# Patient Record
Sex: Female | Born: 1970 | Race: Black or African American | Hispanic: No | Smoking: Current every day smoker
Health system: Southern US, Community
[De-identification: ages and names within clinical notes are randomized; demographics above are authoritative.]

## PROBLEM LIST (undated history)

## (undated) DIAGNOSIS — K5792 Diverticulitis of intestine, part unspecified, without perforation or abscess without bleeding: Secondary | ICD-10-CM

## (undated) DIAGNOSIS — K219 Gastro-esophageal reflux disease without esophagitis: Secondary | ICD-10-CM

## (undated) DIAGNOSIS — K5790 Diverticulosis of intestine, part unspecified, without perforation or abscess without bleeding: Secondary | ICD-10-CM

## (undated) DIAGNOSIS — G43909 Migraine, unspecified, not intractable, without status migrainosus: Secondary | ICD-10-CM

## (undated) DIAGNOSIS — R42 Dizziness and giddiness: Secondary | ICD-10-CM

## (undated) HISTORY — PX: UMBILICAL HERNIA REPAIR: SHX196

## (undated) HISTORY — DX: Gastro-esophageal reflux disease without esophagitis: K21.9

## (undated) HISTORY — DX: Diverticulosis of intestine, part unspecified, without perforation or abscess without bleeding: K57.90

## (undated) HISTORY — PX: TUBAL LIGATION: SHX77

## (undated) HISTORY — PX: OTHER SURGICAL HISTORY: SHX169

## (undated) HISTORY — DX: Migraine, unspecified, not intractable, without status migrainosus: G43.909

---

## 1993-04-28 HISTORY — PX: THYROIDECTOMY: SHX17

## 1997-11-10 ENCOUNTER — Emergency Department (HOSPITAL_COMMUNITY): Admission: EM | Admit: 1997-11-10 | Discharge: 1997-11-11 | Payer: Self-pay | Admitting: Emergency Medicine

## 1997-11-28 ENCOUNTER — Other Ambulatory Visit: Admission: RE | Admit: 1997-11-28 | Discharge: 1997-11-28 | Payer: Self-pay | Admitting: Obstetrics and Gynecology

## 1998-01-11 ENCOUNTER — Emergency Department (HOSPITAL_COMMUNITY): Admission: EM | Admit: 1998-01-11 | Discharge: 1998-01-11 | Payer: Self-pay

## 1998-01-18 ENCOUNTER — Emergency Department (HOSPITAL_COMMUNITY): Admission: EM | Admit: 1998-01-18 | Discharge: 1998-01-18 | Payer: Self-pay | Admitting: Emergency Medicine

## 1998-06-13 ENCOUNTER — Emergency Department (HOSPITAL_COMMUNITY): Admission: EM | Admit: 1998-06-13 | Discharge: 1998-06-13 | Payer: Self-pay | Admitting: Emergency Medicine

## 1999-02-12 ENCOUNTER — Emergency Department (HOSPITAL_COMMUNITY): Admission: EM | Admit: 1999-02-12 | Discharge: 1999-02-12 | Payer: Self-pay | Admitting: Emergency Medicine

## 1999-03-04 ENCOUNTER — Other Ambulatory Visit: Admission: RE | Admit: 1999-03-04 | Discharge: 1999-03-04 | Payer: Self-pay | Admitting: *Deleted

## 1999-07-19 ENCOUNTER — Ambulatory Visit (HOSPITAL_COMMUNITY): Admission: RE | Admit: 1999-07-19 | Discharge: 1999-07-19 | Payer: Self-pay | Admitting: Obstetrics and Gynecology

## 2000-02-17 ENCOUNTER — Ambulatory Visit (HOSPITAL_COMMUNITY): Admission: RE | Admit: 2000-02-17 | Discharge: 2000-02-17 | Payer: Self-pay | Admitting: Family Medicine

## 2000-02-17 ENCOUNTER — Encounter: Payer: Self-pay | Admitting: Family Medicine

## 2000-11-21 ENCOUNTER — Emergency Department (HOSPITAL_COMMUNITY): Admission: EM | Admit: 2000-11-21 | Discharge: 2000-11-21 | Payer: Self-pay

## 2000-11-23 ENCOUNTER — Emergency Department (HOSPITAL_COMMUNITY): Admission: EM | Admit: 2000-11-23 | Discharge: 2000-11-23 | Payer: Self-pay | Admitting: Emergency Medicine

## 2010-07-13 ENCOUNTER — Emergency Department (HOSPITAL_COMMUNITY)
Admission: EM | Admit: 2010-07-13 | Discharge: 2010-07-13 | Disposition: A | Discharge status: Home / Self Care | Payer: Self-pay | Attending: Emergency Medicine | Admitting: Emergency Medicine

## 2010-07-13 DIAGNOSIS — K219 Gastro-esophageal reflux disease without esophagitis: Secondary | ICD-10-CM | POA: Insufficient documentation

## 2010-07-13 DIAGNOSIS — A5903 Trichomonal cystitis and urethritis: Secondary | ICD-10-CM | POA: Insufficient documentation

## 2010-07-13 DIAGNOSIS — M7989 Other specified soft tissue disorders: Secondary | ICD-10-CM | POA: Insufficient documentation

## 2010-07-13 DIAGNOSIS — R079 Chest pain, unspecified: Secondary | ICD-10-CM | POA: Insufficient documentation

## 2010-07-13 DIAGNOSIS — R110 Nausea: Secondary | ICD-10-CM | POA: Insufficient documentation

## 2010-07-13 LAB — COMPREHENSIVE METABOLIC PANEL
ALT: 14 U/L (ref 0–35)
AST: 15 U/L (ref 0–37)
Albumin: 3.3 g/dL — ABNORMAL LOW (ref 3.5–5.2)
Alkaline Phosphatase: 37 U/L — ABNORMAL LOW (ref 39–117)
BUN: 6 mg/dL (ref 6–23)
CO2: 29 mEq/L (ref 19–32)
Calcium: 8.8 mg/dL (ref 8.4–10.5)
Chloride: 106 mEq/L (ref 96–112)
Creatinine, Ser: 0.72 mg/dL (ref 0.4–1.2)
GFR calc Af Amer: >60 mL/min (ref >60)
GFR calc non Af Amer: >60 mL/min (ref >60)
Glucose, Bld: 88 mg/dL (ref 70–99)
Potassium: 4.3 mEq/L (ref 3.5–5.1)
Sodium: 140 mEq/L (ref 135–145)
Total Bilirubin: 0.3 mg/dL (ref 0.3–1.2)
Total Protein: 6.1 g/dL (ref 6.0–8.3)

## 2010-07-13 LAB — URINALYSIS, ROUTINE W REFLEX MICROSCOPIC
Bilirubin Urine: NEGATIVE
Glucose, UA: NEGATIVE mg/dL
Ketones, ur: NEGATIVE mg/dL
Nitrite: NEGATIVE
Protein, ur: NEGATIVE mg/dL
Specific Gravity, Urine: 1.011 (ref 1.005–1.030)
Urobilinogen, UA: 0.2 mg/dL (ref 0.0–1.0)
pH: 8 (ref 5.0–8.0)

## 2010-07-13 LAB — URINE MICROSCOPIC-ADD ON

## 2010-07-13 LAB — PREGNANCY, URINE: Preg Test, Ur: NEGATIVE

## 2010-07-15 LAB — URINE CULTURE
Colony Count: 35000
Culture  Setup Time: 201203181159

## 2010-10-01 ENCOUNTER — Emergency Department (HOSPITAL_COMMUNITY)
Admission: EM | Admit: 2010-10-01 | Discharge: 2010-10-01 | Disposition: A | Discharge status: Home / Self Care | Payer: Self-pay | Attending: Emergency Medicine | Admitting: Emergency Medicine

## 2010-10-01 DIAGNOSIS — R062 Wheezing: Secondary | ICD-10-CM | POA: Insufficient documentation

## 2010-10-01 DIAGNOSIS — J40 Bronchitis, not specified as acute or chronic: Secondary | ICD-10-CM | POA: Insufficient documentation

## 2010-10-01 DIAGNOSIS — R059 Cough, unspecified: Secondary | ICD-10-CM | POA: Insufficient documentation

## 2010-10-01 DIAGNOSIS — R05 Cough: Secondary | ICD-10-CM | POA: Insufficient documentation

## 2012-10-18 ENCOUNTER — Emergency Department (HOSPITAL_BASED_OUTPATIENT_CLINIC_OR_DEPARTMENT_OTHER): Payer: Self-pay

## 2012-10-18 ENCOUNTER — Encounter (HOSPITAL_BASED_OUTPATIENT_CLINIC_OR_DEPARTMENT_OTHER): Payer: Self-pay

## 2012-10-18 ENCOUNTER — Emergency Department (HOSPITAL_BASED_OUTPATIENT_CLINIC_OR_DEPARTMENT_OTHER)
Admission: EM | Admit: 2012-10-18 | Discharge: 2012-10-18 | Disposition: A | Discharge status: Home / Self Care | Payer: Self-pay | Attending: Emergency Medicine | Admitting: Emergency Medicine

## 2012-10-18 DIAGNOSIS — Z8719 Personal history of other diseases of the digestive system: Secondary | ICD-10-CM | POA: Insufficient documentation

## 2012-10-18 DIAGNOSIS — Z8669 Personal history of other diseases of the nervous system and sense organs: Secondary | ICD-10-CM | POA: Insufficient documentation

## 2012-10-18 DIAGNOSIS — M542 Cervicalgia: Secondary | ICD-10-CM | POA: Insufficient documentation

## 2012-10-18 DIAGNOSIS — M79609 Pain in unspecified limb: Secondary | ICD-10-CM | POA: Insufficient documentation

## 2012-10-18 HISTORY — DX: Diverticulitis of intestine, part unspecified, without perforation or abscess without bleeding: K57.92

## 2012-10-18 HISTORY — DX: Dizziness and giddiness: R42

## 2012-10-18 MED ORDER — DIAZEPAM 5 MG PO TABS: 5.0000 mg | ORAL_TABLET | Freq: Two times a day (BID) | ORAL | Status: DC

## 2012-10-18 MED ORDER — HYDROCODONE-ACETAMINOPHEN 5-325 MG PO TABS: 1.0000 | ORAL_TABLET | Freq: Four times a day (QID) | ORAL | Status: DC | PRN

## 2012-10-18 MED ORDER — DIAZEPAM 5 MG/ML IJ SOLN
5.0000 mg | Freq: Once | INTRAMUSCULAR | Status: AC
  Administered 2012-10-18: 5 mg via INTRAMUSCULAR
  Filled 2012-10-18: qty 2

## 2012-10-18 MED ORDER — KETOROLAC TROMETHAMINE 30 MG/ML IJ SOLN
60.0000 mg | Freq: Once | INTRAMUSCULAR | Status: AC
  Administered 2012-10-18: 60 mg via INTRAMUSCULAR
  Filled 2012-10-18 (×2): qty 1

## 2012-10-18 NOTE — ED Provider Notes (Signed)
Medical screening examination/treatment/procedure(s) were performed by non-physician practitioner and as supervising physician I was immediately available for consultation/collaboration.   Rolan Bucco, MD 10/18/12 [phone]

## 2012-10-18 NOTE — ED Provider Notes (Signed)
History    CSN: [id number] Arrival date & time 10/18/12  1845  First MD Initiated Contact with Patient 10/18/12 1905     Chief Complaint  Patient presents with  . Neck Pain  . Arm Pain   (Consider location/radiation/quality/duration/timing/severity/associated sxs/prior Treatment) HPI Comments: Pt states that she has no known injury:pt states that he pain radiates down her arm and it feels like it is asleep  Patient is a 42 y.o. female presenting with neck pain and arm pain. The history is provided by the patient. No language interpreter was used.  Neck Pain Pain location:  L side Quality:  Aching Pain radiates to:  L arm Pain severity:  Severe Pain is:  Same all the time Onset quality:  Gradual Timing:  Constant Progression:  Unable to specify Chronicity:  New Worsened by:  Nothing tried Ineffective treatments:  Heat and ice Associated symptoms: no weakness   Arm Pain Associated symptoms include neck pain. Pertinent negatives include no weakness.   Past Medical History  Diagnosis Date  . Vertigo   . Diverticulitis    Past Surgical History  Procedure Laterality Date  . Tubal ligation    . Thyroidectomy     No family history on file. History  Substance Use Topics  . Smoking status: Never Smoker   . Smokeless tobacco: Not on file  . Alcohol Use: No   OB History   Grav Para Term Preterm Abortions TAB SAB Ect Mult Living                 Review of Systems  HENT: Positive for neck pain.   Respiratory: Negative.   Cardiovascular: Negative.   Neurological: Negative for weakness.    Allergies  Review of patient's allergies indicates no known allergies.  Home Medications   Current Outpatient Rx  Name  Route  Sig  Dispense  Refill  . meclizine (ANTIVERT) 25 MG tablet   Oral   Take 25 mg by mouth 3 (three) times daily as needed.          BP 121/65  Pulse 82  Temp(Src) 98.8 F (37.1 C) (Oral)  Resp 18  Ht 5\' 2"  (1.575 m)  Wt 178 lb (80.74 kg)  BMI  32.55 kg/m2  SpO2 100%  LMP 10/02/2012 Physical Exam  Nursing note and vitals reviewed. Constitutional: She is oriented to person, place, and time. She appears well-developed and well-nourished.  Cardiovascular: Normal rate and regular rhythm.   Pulmonary/Chest: Effort normal and breath sounds normal.  Musculoskeletal: Normal range of motion.  Left cervical paraspinal tenderness:grip strength equal:pt has full rom of left WUJ:[id number] intact:pulses intact  Neurological: She is alert and oriented to person, place, and time.  Skin: Skin is warm and dry.  Psychiatric: She has a normal mood and affect.    ED Course  Procedures (including critical care time) Labs Reviewed - No data to display Dg Cervical Spine Complete  10/18/2012   *RADIOLOGY REPORT*  Clinical Data: Left-sided neck pain radiating to the left arm, no injury, prior thyroidectomy  CERVICAL SPINE - COMPLETE 4+ VIEW  Comparison: None.  Findings: The cervical vertebrae are straightened in alignment. Intervertebral disc spaces appear normal.  No prevertebral soft tissue swelling is seen.  Multiple surgical clips overlie the lower neck from prior right thyroidectomy.  On oblique views, the foramina are widely patent.  The odontoid process is intact.  The lung apices are clear.  IMPRESSION:  1.  Straightened alignment with normal disc spaces. 2.  The foramina are patent bilaterally.   Original Report Authenticated By: Dwyane Dee, M.D.   1. Neck pain     MDM  Pt feeling better at this time:will treat symptomatically with vicodin and valium and have follow up with Dr. Vivi Barrack, NP 10/18/12 2022

## 2012-10-18 NOTE — ED Notes (Signed)
Pt reports neck pain radiating to left arm associated with intermittent numbness in left arm since yesterday.

## 2012-10-26 ENCOUNTER — Emergency Department (HOSPITAL_BASED_OUTPATIENT_CLINIC_OR_DEPARTMENT_OTHER)
Admission: EM | Admit: 2012-10-26 | Discharge: 2012-10-26 | Disposition: A | Discharge status: Home / Self Care | Payer: Self-pay | Attending: Emergency Medicine | Admitting: Emergency Medicine

## 2012-10-26 ENCOUNTER — Encounter (HOSPITAL_BASED_OUTPATIENT_CLINIC_OR_DEPARTMENT_OTHER): Payer: Self-pay | Admitting: *Deleted

## 2012-10-26 DIAGNOSIS — M5412 Radiculopathy, cervical region: Secondary | ICD-10-CM | POA: Insufficient documentation

## 2012-10-26 DIAGNOSIS — Z8719 Personal history of other diseases of the digestive system: Secondary | ICD-10-CM | POA: Insufficient documentation

## 2012-10-26 DIAGNOSIS — R42 Dizziness and giddiness: Secondary | ICD-10-CM | POA: Insufficient documentation

## 2012-10-26 DIAGNOSIS — M436 Torticollis: Secondary | ICD-10-CM | POA: Insufficient documentation

## 2012-10-26 DIAGNOSIS — Z87891 Personal history of nicotine dependence: Secondary | ICD-10-CM | POA: Insufficient documentation

## 2012-10-26 DIAGNOSIS — R209 Unspecified disturbances of skin sensation: Secondary | ICD-10-CM | POA: Insufficient documentation

## 2012-10-26 MED ORDER — OXYCODONE-ACETAMINOPHEN 5-325 MG PO TABS
1.0000 | ORAL_TABLET | Freq: Once | ORAL | Status: AC
  Administered 2012-10-26: 1 via ORAL
  Filled 2012-10-26 (×2): qty 1

## 2012-10-26 MED ORDER — CYCLOBENZAPRINE HCL 10 MG PO TABS: 10.0000 mg | ORAL_TABLET | Freq: Three times a day (TID) | ORAL | Status: DC | PRN

## 2012-10-26 MED ORDER — CYCLOBENZAPRINE HCL 10 MG PO TABS
10.0000 mg | ORAL_TABLET | Freq: Once | ORAL | Status: AC
  Administered 2012-10-26: 10 mg via ORAL
  Filled 2012-10-26: qty 1

## 2012-10-26 MED ORDER — OXYCODONE-ACETAMINOPHEN 5-325 MG PO TABS: 1.0000 | ORAL_TABLET | ORAL | Status: DC | PRN

## 2012-10-26 NOTE — ED Provider Notes (Signed)
History  This chart was scribed for Kristin Booze, MD by Ardelia Mems, ED Scribe. This patient was seen in room MH01/MH01 and the patient's care was started at 12:39 AM.  CSN: [id number]  Arrival date & time 10/26/12  0011     Chief Complaint  Patient presents with  . Neck Pain    The history is provided by the patient. No language interpreter was used.   HPI Comments: Kristin Mcintyre is a 42 y.o. female who presents to the Emergency Department complaining of 1 week of constant, severe, "9/10" left-sided neck pain that radiates to her left shoulder pain and left arm. She reports associated intermittent numbness and tingling to the 4th and 5th digits of her right hand. Pt is right-handed. Pt was seen in the ED for the same pain on 10/18/12 and was discharged with Valium and Vicodin which offered relief of her pain.  Pt did not follow-up with Dr. Andree Coss as instructed on discharge. Pt denies injury and states that her pain is worse than 1 week ago. Pt states that she has been driving, but has otherwise not exacerbated her neck pain. Pt is lying with her head turned to the right and states that this is more comfortable for her. Pt states that she is now out of the her Vicodin and Valium, and she requests for pain medication, although not specifically for narcotics. Pt states that she is allergic to Tylenol. Pt states that she is i otherwise good health. Pt denies back pain, abdominal pain, fever, vomiting or any other symptoms. Pt states that she is a former smoker.   PCP- None  Past Medical History  Diagnosis Date  . Vertigo   . Diverticulitis    Past Surgical History  Procedure Laterality Date  . Tubal ligation    . Thyroidectomy     History reviewed. No pertinent family history. History  Substance Use Topics  . Smoking status: Never Smoker   . Smokeless tobacco: Not on file  . Alcohol Use: No   OB History   Grav Para Term Preterm Abortions TAB SAB Ect Mult Living                  Review of Systems  Constitutional: Negative for chills.  HENT: Positive for neck stiffness. Negative for congestion, sore throat and rhinorrhea.   Eyes: Negative for visual disturbance.  Respiratory: Negative for cough.   Gastrointestinal: Negative for nausea, vomiting, abdominal pain and diarrhea.  Genitourinary: Negative for dysuria.  Musculoskeletal: Negative for back pain.  Skin: Negative for pallor.  Neurological:       Intermittent tingling.  Psychiatric/Behavioral: Negative for confusion.  A complete 10 system review of systems was obtained and all systems are negative except as noted in the HPI and PMH.   Allergies  Review of patient's allergies indicates no known allergies.  Home Medications   Current Outpatient Rx  Name  Route  Sig  Dispense  Refill  . diazepam (VALIUM) 5 MG tablet   Oral   Take 1 tablet (5 mg total) by mouth 2 (two) times daily.   10 tablet   0   . HYDROcodone-acetaminophen (NORCO/VICODIN) 5-325 MG per tablet   Oral   Take 1 tablet by mouth every 6 (six) hours as needed for pain.   10 tablet   0   . meclizine (ANTIVERT) 25 MG tablet   Oral   Take 25 mg by mouth 3 (three) times daily as needed.  Triage Vitals: BP 113/61  Pulse 82  Temp(Src) 97.8 F (36.6 C) (Oral)  Resp 18  Ht 5\' 2"  (1.575 m)  Wt 178 lb (80.74 kg)  BMI 32.55 kg/m2  SpO2 100%  LMP 10/02/2012  Physical Exam  Nursing note and vitals reviewed. Constitutional: She appears well-developed and well-nourished.  Head rotated to her right.  HENT:  Head: Normocephalic and atraumatic.  Eyes: EOM are normal. Pupils are equal, round, and reactive to light.  Neck: No tracheal deviation present.  Mild tenderness of C-spine. Marked spasm and tenderness of the left sternocleidomastoid muscle. Pain with passive ROM.  Cardiovascular: Normal rate and regular rhythm.   Pulmonary/Chest: Effort normal. No respiratory distress.  Abdominal: Soft. There is no tenderness.   Neurological: She is alert.  Skin: Skin is warm and dry. No rash noted.  Psychiatric: She has a normal mood and affect. Her behavior is normal.    ED Course  Procedures (including critical care time)  DIAGNOSTIC STUDIES: Oxygen Saturation is 100% on RA, normal by my interpretation.    COORDINATION OF CARE: 12:44 AM- Pt advised of plan for pain management with flexeril and Oxycodone and pt agrees.  Medications  cyclobenzaprine (FLEXERIL) tablet 10 mg (not administered)  oxyCODONE-acetaminophen (PERCOCET/ROXICET) 5-325 MG per tablet 1 tablet (not administered)     1. Torticollis   2. Cervical radiculopathy     MDM  Neck pain with torticollis. New numbness suggests component of a cervical radiculopathy. No evidence of neurologic injury on exam. And she was seen in the ED 1 week ago and diagnosed with neck pain and discharged with a prescription for 10 Valium and 10 Vicodin tablets. She had been referred to sports medicine but had not followed up. She's given repeat referral to sports medicine. There is no indication for cervical spine imaging tonight. She's given prescriptions for Percocet, and Flexeril. She states that she is intolerant of NSAIDs.    I personally performed the services described in this documentation, which was scribed in my presence. The recorded information has been reviewed and is accurate.     Kristin Booze, MD 10/26/12 [phone]

## 2012-10-26 NOTE — ED Notes (Addendum)
Pt reports left side neck pain which radiates into left shoulder and arm x 1 week denies injury seen here 6/23 for same didn't f/u with [facility] , pt reports out of valium and vicodin

## 2012-10-27 ENCOUNTER — Encounter: Payer: Self-pay | Admitting: Family Medicine

## 2012-10-27 ENCOUNTER — Ambulatory Visit (INDEPENDENT_AMBULATORY_CARE_PROVIDER_SITE_OTHER): Payer: Self-pay | Admitting: Family Medicine

## 2012-10-27 VITALS — BP 109/74 | HR 102 | Ht 62.0 in | Wt 178.0 lb

## 2012-10-27 DIAGNOSIS — M5010 Cervical disc disorder with radiculopathy, unspecified cervical region: Secondary | ICD-10-CM

## 2012-10-27 DIAGNOSIS — M5412 Radiculopathy, cervical region: Secondary | ICD-10-CM

## 2012-10-27 MED ORDER — PREDNISONE 10 MG PO TABS: ORAL_TABLET | ORAL | Status: DC

## 2012-10-27 MED ORDER — DIAZEPAM 5 MG PO TABS: 5.0000 mg | ORAL_TABLET | Freq: Three times a day (TID) | ORAL | Status: DC | PRN

## 2012-10-27 MED ORDER — OXYCODONE-ACETAMINOPHEN 5-325 MG PO TABS: 1.0000 | ORAL_TABLET | Freq: Four times a day (QID) | ORAL | Status: DC | PRN

## 2012-10-27 NOTE — Patient Instructions (Addendum)
You have cervical radiculopathy (a pinched nerve in the neck). Prednisone 6 day dose pack as directed. Valium as needed for spasms - no driving on this. Percocet as needed for severe pain (no driving on this medicine). Consider cervical collar if severely painful. Simple range of motion exercises within limits of pain to prevent further stiffness. Consider physical therapy for stretching, exercises, traction, and modalities. Call me in 1 week to let me know how you're doing - if you're better we'll start physical therapy.  If worsening next step is usually an MRI. Heat 15 minutes at a time 3-4 times a day to help with spasms. Watch head position when on computers, texting, when sleeping in bed - should in line with back to prevent further nerve traction and irritation. Follow up in 6 weeks if you start physical therapy and are doing well.

## 2012-11-01 ENCOUNTER — Encounter: Payer: Self-pay | Admitting: Family Medicine

## 2012-11-01 DIAGNOSIS — M79602 Pain in left arm: Secondary | ICD-10-CM | POA: Insufficient documentation

## 2012-11-01 NOTE — Assessment & Plan Note (Signed)
Left cervical radiculopathy - start with prednisone dose pack, valium and percocet as needed.  Cervical collar for support.  Will see how she does over the next week - consider PT if improving, MRI if not improving to assess for disc herniation.  Heat as needed for spasms.  Discussed ergonomic issues.  If doing well f/u in 6 weeks.

## 2012-11-01 NOTE — Progress Notes (Signed)
Patient ID: Kristin Mcintyre, female   DOB: [birth year], 42 y.o.   MRN: [id number]  PCP: No primary provider on file.  Subjective:   HPI: Patient is a 42 y.o. female here for neck pain.  Patient reports she started to develop neck pain at the end of May though worsened about 1-2 weeks ago. Started to get pain reaching for thing with pain radiating down left arm to wrist and fingers with numbness and tingling. + night pain. No bowel/bladder dysfunction. Tried icing, pain medication and valium from ED. No prior issues with neck.  Past Medical History  Diagnosis Date  . Vertigo   . Diverticulitis   . GERD (gastroesophageal reflux disease)     Current Outpatient Prescriptions on File Prior to Visit  Medication Sig Dispense Refill  . cyclobenzaprine (FLEXERIL) 10 MG tablet Take 1 tablet (10 mg total) by mouth 3 (three) times daily as needed for muscle spasms.  30 tablet  0  . meclizine (ANTIVERT) 25 MG tablet Take 25 mg by mouth 3 (three) times daily as needed.       No current facility-administered medications on file prior to visit.    Past Surgical History  Procedure Laterality Date  . Tubal ligation    . Thyroidectomy      No Known Allergies  History   Social History  . Marital Status: Widowed    Spouse Name: N/A    Number of Children: N/A  . Years of Education: N/A   Occupational History  . Not on file.   Social History Main Topics  . Smoking status: Never Smoker   . Smokeless tobacco: Not on file  . Alcohol Use: No  . Drug Use: No  . Sexually Active: Yes    Birth Control/ Protection: None   Other Topics Concern  . Not on file   Social History Narrative  . No narrative on file    Family History  Problem Relation Age of Onset  . Sudden death Neg Hx   . Hypertension Neg Hx   . Hyperlipidemia Neg Hx   . Heart attack Neg Hx   . Diabetes Neg Hx     BP 109/74  Pulse 102  Ht 5\' 2"  (1.575 m)  Wt 178 lb (80.74 kg)  BMI 32.55 kg/m2  LMP  10/02/2012  Review of Systems: See HPI above.    Objective:  Physical Exam:  Gen: NAD  Neck: No gross deformity, swelling, bruising. TTP left cervical paraspinal region.  No midline/bony TTP. FROM neck - pain with extension, flexion, left lateral rotation. BUE strength 5/5.   Sensation diminished to light touch in left hand, forearm.   2+ equal reflexes in triceps, biceps, brachioradialis tendons. Negative spurlings.    Assessment & Plan:  1. Left cervical radiculopathy - start with prednisone dose pack, valium and percocet as needed.  Cervical collar for support.  Will see how she does over the next week - consider PT if improving, MRI if not improving to assess for disc herniation.  Heat as needed for spasms.  Discussed ergonomic issues.  If doing well f/u in 6 weeks.

## 2012-11-05 ENCOUNTER — Ambulatory Visit: Payer: Self-pay

## 2012-12-02 ENCOUNTER — Ambulatory Visit (INDEPENDENT_AMBULATORY_CARE_PROVIDER_SITE_OTHER): Payer: Self-pay | Admitting: Family Medicine

## 2012-12-02 ENCOUNTER — Encounter: Payer: Self-pay | Admitting: Family Medicine

## 2012-12-02 VITALS — BP 116/67 | HR 91 | Ht 62.0 in | Wt 178.0 lb

## 2012-12-02 DIAGNOSIS — M5412 Radiculopathy, cervical region: Secondary | ICD-10-CM

## 2012-12-02 DIAGNOSIS — M5010 Cervical disc disorder with radiculopathy, unspecified cervical region: Secondary | ICD-10-CM

## 2012-12-02 MED ORDER — PREDNISONE 10 MG PO TABS: ORAL_TABLET | ORAL | Status: DC

## 2012-12-03 NOTE — Patient Instructions (Addendum)
Verbal: Patient will call us when she receives medicaid card to go ahead with MRI

## 2012-12-06 ENCOUNTER — Encounter: Payer: Self-pay | Admitting: Family Medicine

## 2012-12-06 NOTE — Assessment & Plan Note (Signed)
still awaiting medicaid card to go ahead with next steps.  Suspect disc herniation as cause.  Continue with home stretches, exercises, cervical collar.  Trial longer prednisone dose pack.  Continue other medicines as she has at home (valium, percocet as needed).  MRI as next step and consider ESIs, neurosurgery referral depending on results.

## 2012-12-06 NOTE — Progress Notes (Signed)
Patient ID: Kristin Mcintyre, female   DOB: [birth year], 42 y.o.   MRN: [id number]  PCP: No primary provider on file.  Subjective:   HPI: Patient is a 42 y.o. female here for f/u neck pain.  7/2: Patient reports she started to develop neck pain at the end of May though worsened about 1-2 weeks ago. Started to get pain reaching for thing with pain radiating down left arm to wrist and fingers with numbness and tingling. + night pain. No bowel/bladder dysfunction. Tried icing, pain medication and valium from ED. No prior issues with neck.  8/7: Patient states the prednisone helped while she was on it but this recurred about 2 days after finishing the medicine. Applied and got medicaid but does not have the card for this yet. Using cervical collar. Taking valium, oxycodone and using heat. Radiates down to shoulder, elbow, middle finger on left. No bowel/bladder dysfunction.  Past Medical History  Diagnosis Date  . Vertigo   . Diverticulitis   . GERD (gastroesophageal reflux disease)     Current Outpatient Prescriptions on File Prior to Visit  Medication Sig Dispense Refill  . cyclobenzaprine (FLEXERIL) 10 MG tablet Take 1 tablet (10 mg total) by mouth 3 (three) times daily as needed for muscle spasms.  30 tablet  0  . diazepam (VALIUM) 5 MG tablet Take 1 tablet (5 mg total) by mouth every 8 (eight) hours as needed for anxiety.  60 tablet  0  . meclizine (ANTIVERT) 25 MG tablet Take 25 mg by mouth 3 (three) times daily as needed.      Marland Kitchen oxyCODONE-acetaminophen (ROXICET) 5-325 MG per tablet Take 1 tablet by mouth every 6 (six) hours as needed for pain.  60 tablet  0   No current facility-administered medications on file prior to visit.    Past Surgical History  Procedure Laterality Date  . Tubal ligation    . Thyroidectomy      No Known Allergies  History   Social History  . Marital Status: Widowed    Spouse Name: N/A    Number of Children: N/A  . Years of Education:  N/A   Occupational History  . Not on file.   Social History Main Topics  . Smoking status: Never Smoker   . Smokeless tobacco: Not on file  . Alcohol Use: No  . Drug Use: No  . Sexually Active: Yes    Birth Control/ Protection: None   Other Topics Concern  . Not on file   Social History Narrative  . No narrative on file    Family History  Problem Relation Age of Onset  . Sudden death Neg Hx   . Hypertension Neg Hx   . Hyperlipidemia Neg Hx   . Heart attack Neg Hx   . Diabetes Neg Hx     BP 116/67  Pulse 91  Ht 5\' 2"  (1.575 m)  Wt 178 lb (80.74 kg)  BMI 32.55 kg/m2  Review of Systems: See HPI above.    Objective:  Physical Exam:  Gen: NAD  Neck: No gross deformity, swelling, bruising. TTP left > right cervical paraspinal region.  No midline/bony TTP. FROM neck - pain with extension, flexion, left lateral rotation. BUE strength 5/5.   Sensation diminished to light touch in left hand, forearm, 3rd digit.   2+ equal reflexes in triceps, biceps, brachioradialis tendons. Negative spurlings.    Assessment & Plan:  1. Left cervical radiculopathy - still awaiting medicaid card to go ahead with  next steps.  Suspect disc herniation as cause.  Continue with home stretches, exercises, cervical collar.  Trial longer prednisone dose pack.  Continue other medicines as she has at home (valium, percocet as needed).  MRI as next step and consider ESIs, neurosurgery referral depending on results.

## 2013-01-06 NOTE — Addendum Note (Signed)
Addended by: Lenda Kelp on: 01/06/2013 12:50 PM   Modules accepted: Orders

## 2013-01-17 ENCOUNTER — Telehealth: Payer: Self-pay | Admitting: *Deleted

## 2013-01-17 NOTE — Telephone Encounter (Signed)
No refills on those two medications.  A month and a half ago she said she had medicaid and was waiting on the card so we could do an MRI, consider ESIs.  Check on status of that.  Thanks!

## 2013-01-17 NOTE — Telephone Encounter (Signed)
Patient is out of Roxicet 5/325 and also the valium 5 mg. Has been out for about 2-3 weeks. Wants to know if she can get a refill on both of these prescriptions.

## 2013-01-17 NOTE — Telephone Encounter (Signed)
A MRI has been set for her on 01-18-13 (tomorrow). Then she has an appointment here on 01-19-13 (wednesday).

## 2013-01-18 ENCOUNTER — Ambulatory Visit (INDEPENDENT_AMBULATORY_CARE_PROVIDER_SITE_OTHER): Payer: Medicaid Other

## 2013-01-18 DIAGNOSIS — M5124 Other intervertebral disc displacement, thoracic region: Secondary | ICD-10-CM

## 2013-01-18 DIAGNOSIS — M5010 Cervical disc disorder with radiculopathy, unspecified cervical region: Secondary | ICD-10-CM

## 2013-01-19 ENCOUNTER — Ambulatory Visit (INDEPENDENT_AMBULATORY_CARE_PROVIDER_SITE_OTHER): Payer: Medicaid Other | Admitting: Family Medicine

## 2013-01-19 DIAGNOSIS — M79602 Pain in left arm: Secondary | ICD-10-CM

## 2013-01-19 DIAGNOSIS — M542 Cervicalgia: Secondary | ICD-10-CM

## 2013-01-19 DIAGNOSIS — M79609 Pain in unspecified limb: Secondary | ICD-10-CM

## 2013-01-19 MED ORDER — TRAMADOL HCL 50 MG PO TABS: 50.0000 mg | ORAL_TABLET | Freq: Four times a day (QID) | ORAL | Status: DC | PRN

## 2013-01-19 MED ORDER — CYCLOBENZAPRINE HCL 10 MG PO TABS: 10.0000 mg | ORAL_TABLET | Freq: Three times a day (TID) | ORAL | Status: DC | PRN

## 2013-01-19 NOTE — Patient Instructions (Addendum)
Unfortunately your MRI did not find the cause of your pain and radiation into chest, left arm. We will refer you to neurology for further evaluation. In meantime tylenol + tramadol as needed for pain. Flexeril as needed for spasms. Follow up with me as needed.

## 2013-01-20 ENCOUNTER — Encounter: Payer: Self-pay | Admitting: Family Medicine

## 2013-01-20 NOTE — Progress Notes (Signed)
Patient ID: Kristin Mcintyre, female   DOB: [birth year], 42 y.o.   MRN: [id number]  PCP: No primary provider on file.  Subjective:   HPI: Patient is a 42 y.o. female here for f/u neck pain.  7/2: Patient reports she started to develop neck pain at the end of May though worsened about 1-2 weeks ago. Started to get pain reaching for thing with pain radiating down left arm to wrist and fingers with numbness and tingling. + night pain. No bowel/bladder dysfunction. Tried icing, pain medication and valium from ED. No prior issues with neck.  8/7: Patient states the prednisone helped while she was on it but this recurred about 2 days after finishing the medicine. Applied and got medicaid but does not have the card for this yet. Using cervical collar. Taking valium, oxycodone and using heat. Radiates down to shoulder, elbow, middle finger on left. No bowel/bladder dysfunction.  9/24: Patient returns for MRI results. Continues to have pain in neck and down both shoulders but mainly into left shoulder, clavicle, breast. Feels like it's swollen here. Goes all the way down to fingers of left hand and reports numbness thoughout. No bowel/bladder dysfunction. Out of valium, oxycodone.  Past Medical History  Diagnosis Date  . Vertigo   . Diverticulitis   . GERD (gastroesophageal reflux disease)     Current Outpatient Prescriptions on File Prior to Visit  Medication Sig Dispense Refill  . meclizine (ANTIVERT) 25 MG tablet Take 25 mg by mouth 3 (three) times daily as needed.       No current facility-administered medications on file prior to visit.    Past Surgical History  Procedure Laterality Date  . Tubal ligation    . Thyroidectomy      No Known Allergies  History   Social History  . Marital Status: Widowed    Spouse Name: N/A    Number of Children: N/A  . Years of Education: N/A   Occupational History  . Not on file.   Social History Main Topics  . Smoking status:  Never Smoker   . Smokeless tobacco: Not on file  . Alcohol Use: No  . Drug Use: No  . Sexual Activity: Yes    Birth Control/ Protection: None   Other Topics Concern  . Not on file   Social History Narrative  . No narrative on file    Family History  Problem Relation Age of Onset  . Sudden death Neg Hx   . Hypertension Neg Hx   . Hyperlipidemia Neg Hx   . Heart attack Neg Hx   . Diabetes Neg Hx     There were no vitals taken for this visit.  Review of Systems: See HPI above.    Objective:  Physical Exam:  Gen: NAD  Neck: No gross deformity, swelling, bruising. TTP left > right cervical paraspinal region.  No midline/bony TTP. FROM neck - pain with extension, flexion, left lateral rotation. BUE strength 5/5.   Sensation diminished to light touch in left hand, forearm.   2+ equal reflexes in triceps, biceps, brachioradialis tendons. Negative spurlings.    Assessment & Plan:  1. Left neck, arm pain/numbness.  MRI shows no evidence of radiculopathy to account for her symptoms into left hand/arm.  Advised to continue with HEP.  Tramadol and flexeril for pain.  Will refer to neurology for evaluation of possible peripheral neuropathy or systemic cause of her symptoms.

## 2013-01-20 NOTE — Assessment & Plan Note (Signed)
MRI shows no evidence of radiculopathy to account for her symptoms into left hand/arm.  Advised to continue with HEP.  Tramadol and flexeril for pain.  Will refer to neurology for evaluation of possible peripheral neuropathy or systemic cause of her symptoms.

## 2013-02-15 ENCOUNTER — Ambulatory Visit (INDEPENDENT_AMBULATORY_CARE_PROVIDER_SITE_OTHER): Payer: Medicaid Other | Admitting: Diagnostic Neuroimaging

## 2013-02-15 ENCOUNTER — Encounter: Payer: Self-pay | Admitting: Diagnostic Neuroimaging

## 2013-02-15 VITALS — BP 112/74 | HR 96 | Temp 98.3°F | Ht 61.5 in | Wt 198.0 lb

## 2013-02-15 DIAGNOSIS — M79609 Pain in unspecified limb: Secondary | ICD-10-CM

## 2013-02-15 DIAGNOSIS — R200 Anesthesia of skin: Secondary | ICD-10-CM

## 2013-02-15 DIAGNOSIS — R42 Dizziness and giddiness: Secondary | ICD-10-CM

## 2013-02-15 DIAGNOSIS — M79602 Pain in left arm: Secondary | ICD-10-CM

## 2013-02-15 DIAGNOSIS — M542 Cervicalgia: Secondary | ICD-10-CM | POA: Insufficient documentation

## 2013-02-15 DIAGNOSIS — R209 Unspecified disturbances of skin sensation: Secondary | ICD-10-CM

## 2013-02-15 NOTE — Progress Notes (Signed)
[facility]  PATIENT: Kristin Mcintyre DOB: November 11, 1970  REFERRING CLINICIAN: Hudnall HISTORY FROM: patient REASON FOR VISIT: new consult   HISTORICAL  CHIEF COMPLAINT:  Chief Complaint  Patient presents with  . Pain    L arm    HISTORY OF PRESENT ILLNESS:   42 year old right-handed female with migraine headaches, here for evaluation of neck pain and left arm numbness and pain.  May 2014 patient developed occipital, upper cervical pain radiating to the left shoulder and left hand. She has some intermittent numbness of her second and third digits. She has intermittent weakness of her left hand. Patient has had MRI of the cervical spine which was unremarkable. She is on some pain medications including tramadol and Flexeril with mild relief.  September 2013 patient episode of vertigo with intermittent dizziness, spinning sensation, nausea, balance difficulty, double vision, upside down vision. Patient takes meclizine which helps with her symptoms. She's never had MRI of the brain.  Patient has some intermittent numbness of her left leg as well if she sleeps on the left side.  No other triggering or aggravating factors. Patient does report intermittent episodes of "bloodshot eyes" without pain. Patient tells me she thinks she has had diagnosis of possible glaucoma in the past.  REVIEW OF SYSTEMS: Full 14 system review of systems performed and notable only for fatigue spinning sensation blurred vision cough increased thirst cramps but mostly dizziness and insomnia headache numbness weakness.  ALLERGIES: Allergies  Allergen Reactions  . Ibuprofen     HOME MEDICATIONS: Outpatient Prescriptions Prior to Visit  Medication Sig Dispense Refill  . cyclobenzaprine (FLEXERIL) 10 MG tablet Take 1 tablet (10 mg total) by mouth 3 (three) times daily as needed for muscle spasms.  90 tablet  0  . meclizine (ANTIVERT) 25 MG tablet Take 25 mg by mouth 3 (three) times daily  as needed.      . traMADol (ULTRAM) 50 MG tablet Take 1 tablet (50 mg total) by mouth every 6 (six) hours as needed for pain.  90 tablet  0   No facility-administered medications prior to visit.    PAST MEDICAL HISTORY: Past Medical History  Diagnosis Date  . Vertigo   . Diverticulitis   . GERD (gastroesophageal reflux disease)   . Migraine     PAST SURGICAL HISTORY: Past Surgical History  Procedure Laterality Date  . Tubal ligation    . Thyroidectomy  1995    FAMILY HISTORY: Family History  Problem Relation Age of Onset  . Sudden death Neg Hx   . Hypertension Neg Hx   . Hyperlipidemia Neg Hx   . Heart attack Neg Hx   . Diabetes Neg Hx   . Colon cancer Mother     SOCIAL HISTORY:  History   Social History  . Marital Status: Widowed    Spouse Name: N/A    Number of Children: 2  . Years of Education: College   Occupational History  .      Self-Employed   Social History Main Topics  . Smoking status: Current Some Day Smoker -- 0.03 packs/day for 6 years    Types: Cigarettes  . Smokeless tobacco: Never Used  . Alcohol Use: Yes     Comment: occasionally  . Drug Use: No  . Sexual Activity: Yes    Birth Control/ Protection: None   Other Topics Concern  . Not on file   Social History Narrative   Patient lives at home with children.   Caffeine  Use: 1-2 cups daily     PHYSICAL EXAM  Filed Vitals:   02/15/13 1014  BP: 112/74  Pulse: 96  Temp: 98.3 F (36.8 C)  TempSrc: Oral  Height: 5' 1.5" (1.562 m)  Weight: 198 lb (89.812 kg)    Not recorded    Body mass index is 36.81 kg/(m^2).  GENERAL EXAM: Patient is in no distress; NECK SUPPLE.  CARDIOVASCULAR: Regular rate and rhythm, no murmurs, no carotid bruits  NEUROLOGIC: MENTAL STATUS: awake, alert, language fluent, comprehension intact, naming intact CRANIAL NERVE: no papilledema on fundoscopic exam, pupils equal and reactive to light, visual fields full to confrontation, extraocular  muscles intact, no nystagmus, facial sensation and strength symmetric, uvula midline, shoulder shrug symmetric, tongue midline. MOTOR: LIMITED (4+) IN LUE DUE TO PAIN. Normal bulk and tone, full strength in the RUE, BLE SENSORY: nDECR TO ALL MODALITIES IN LEFT ARM/HAND (BELOW ELBOW) AND ESP IN DIGITS 2 AND 3 TO PINPRICK. COORDINATION: finger-nose-finger, fine finger movements normal REFLEXES: deep tendon reflexes present and symmetric GAIT/STATION: narrow based gait; able to walk on toes, heels and tandem; romberg is negative   DIAGNOSTIC DATA (LABS, IMAGING, TESTING) - I reviewed patient records, labs, notes, testing and imaging myself where available.  No results found for this basename: WBC, HGB, HCT, MCV, PLT      Component Value Date/Time   NA 140 07/13/2010 1603   K 4.3 07/13/2010 1603   CL 106 07/13/2010 1603   CO2 29 07/13/2010 1603   GLUCOSE 88 07/13/2010 1603   BUN 6 07/13/2010 1603   CREATININE 0.72 07/13/2010 1603   CALCIUM 8.8 07/13/2010 1603   PROT 6.1 07/13/2010 1603   ALBUMIN 3.3* 07/13/2010 1603   AST 15 07/13/2010 1603   ALT 14 07/13/2010 1603   ALKPHOS 37* 07/13/2010 1603   BILITOT 0.3 07/13/2010 1603   GFRNONAA >60 07/13/2010 1603   GFRAA  Value: >60        The eGFR has been calculated using the MDRD equation. This calculation has not been validated in all clinical situations. eGFR's persistently <60 mL/min signify possible Chronic Kidney Disease. 07/13/2010 1603   No results found for this basename: CHOL, HDL, LDLCALC, LDLDIRECT, TRIG, CHOLHDL   No results found for this basename: HGBA1C   No results found for this basename: VITAMINB12   No results found for this basename: TSH   I reviewed images myself and agree with interpretation.   01/18/13 MRI cervical spine: Normal alignment and no acute bony findings.  Normal MR appearance of the cervical spinal cord.  No focal disc protrusions, spinal or foraminal stenosis in the cervical spine.  Disc protrusions at T2-3 and  T3-4 as discussed above.   ASSESSMENT AND PLAN  42 y.o. year old female here with intermittent numbness and pain in the neck radiating to the left arm and hand since May 2014. Also with intermittent vertigo since September 2013. Unclear if these are related or separate issues. MRI c-spine unremarkable.  Ddx: left brachial plexopathy / neuropathy vs CNS inflamm/autoimmune  PLAN: - MRI brain - EMG/NCS - continue PT/OT and pain meds   Orders Placed This Encounter  Procedures  . MR Brain W Wo Contrast  . NCV with EMG(electromyography)   Return for EMG/NCS.    Suanne Marker, MD 02/15/2013, 11:07 AM Certified in Neurology, Neurophysiology and Neuroimaging  [facility] [address], [address] [place], [place] [address] [phone]

## 2013-02-15 NOTE — Patient Instructions (Signed)
Continue current medications.  I will check additional testing.

## 2013-02-16 ENCOUNTER — Ambulatory Visit (INDEPENDENT_AMBULATORY_CARE_PROVIDER_SITE_OTHER): Payer: Medicaid Other | Admitting: Diagnostic Neuroimaging

## 2013-02-16 ENCOUNTER — Encounter (INDEPENDENT_AMBULATORY_CARE_PROVIDER_SITE_OTHER): Payer: Medicaid Other

## 2013-02-16 DIAGNOSIS — R209 Unspecified disturbances of skin sensation: Secondary | ICD-10-CM

## 2013-02-16 DIAGNOSIS — M542 Cervicalgia: Secondary | ICD-10-CM

## 2013-02-16 DIAGNOSIS — M79602 Pain in left arm: Secondary | ICD-10-CM

## 2013-02-16 DIAGNOSIS — R200 Anesthesia of skin: Secondary | ICD-10-CM

## 2013-02-16 DIAGNOSIS — R42 Dizziness and giddiness: Secondary | ICD-10-CM

## 2013-02-16 DIAGNOSIS — Z0289 Encounter for other administrative examinations: Secondary | ICD-10-CM

## 2013-02-24 NOTE — Procedures (Signed)
   [facility]  NCS (NERVE CONDUCTION STUDY) WITH EMG (ELECTROMYOGRAPHY) REPORT   STUDY DATE: 02/16/13 PATIENT NAME: Kristin Mcintyre DOB: Sep 15, 1970 MRN: [id number]  ORDERING CLINICIAN: Joycelyn Schmid, MD   TECHNOLOGIST: Gearldine Shown ELECTROMYOGRAPHER: Glenford Bayley. Penumalli, MD  CLINICAL INFORMATION: 42 y.o. year old female here with intermittent numbness and pain in the neck radiating to the left arm and hand since May 2014   FINDINGS: NERVE CONDUCTION STUDY: Bilateral median, bilateral ulnar, bilateral radial motor responses have normal distal latencies, amplitudes, conduction velocities and F-wave latencies. Bilateral median, ulnar, radial sensory responses are normal.  NEEDLE ELECTROMYOGRAPHY: Needle examination of selected muscles of left upper extremity (deltoid, biceps, triceps, flexor carpi radialis, first dorsal interosseous) shows no abnormal spontaneous activity at rest and normal motor unit recruitment on exertion.  IMPRESSION:  This is a normal study. No electrodiagnostic evidence of large fiber neuropathy or left cervical radiculopathy at this time.   INTERPRETING PHYSICIAN:  Suanne Marker, MD Certified in Neurology, Neurophysiology and Neuroimaging  [facility] [address] [place], [place] [address] [phone]

## 2013-03-02 ENCOUNTER — Ambulatory Visit
Admission: RE | Admit: 2013-03-02 | Discharge: 2013-03-02 | Disposition: A | Discharge status: Home / Self Care | Payer: Medicaid Other | Source: Ambulatory Visit | Attending: Diagnostic Neuroimaging | Admitting: Diagnostic Neuroimaging

## 2013-03-02 DIAGNOSIS — R51 Headache: Secondary | ICD-10-CM

## 2013-03-02 MED ORDER — GADOBENATE DIMEGLUMINE 529 MG/ML IV SOLN
17.0000 mL | Freq: Once | INTRAVENOUS | Status: AC | PRN
  Administered 2013-03-02: 17 mL via INTRAVENOUS

## 2013-03-03 ENCOUNTER — Other Ambulatory Visit: Payer: Medicaid Other

## 2013-03-29 ENCOUNTER — Encounter (HOSPITAL_COMMUNITY): Payer: Self-pay | Admitting: Emergency Medicine

## 2013-03-29 ENCOUNTER — Emergency Department (HOSPITAL_COMMUNITY)
Admission: EM | Admit: 2013-03-29 | Discharge: 2013-03-29 | Disposition: A | Discharge status: Home / Self Care | Payer: Medicaid Other | Attending: Emergency Medicine | Admitting: Emergency Medicine

## 2013-03-29 DIAGNOSIS — Z8679 Personal history of other diseases of the circulatory system: Secondary | ICD-10-CM | POA: Insufficient documentation

## 2013-03-29 DIAGNOSIS — Z8719 Personal history of other diseases of the digestive system: Secondary | ICD-10-CM | POA: Insufficient documentation

## 2013-03-29 DIAGNOSIS — L02214 Cutaneous abscess of groin: Secondary | ICD-10-CM

## 2013-03-29 DIAGNOSIS — F17200 Nicotine dependence, unspecified, uncomplicated: Secondary | ICD-10-CM | POA: Insufficient documentation

## 2013-03-29 DIAGNOSIS — L02219 Cutaneous abscess of trunk, unspecified: Secondary | ICD-10-CM | POA: Insufficient documentation

## 2013-03-29 MED ORDER — OXYCODONE-ACETAMINOPHEN 5-325 MG PO TABS
1.0000 | ORAL_TABLET | Freq: Once | ORAL | Status: AC
  Administered 2013-03-29: 1 via ORAL
  Filled 2013-03-29: qty 1

## 2013-03-29 MED ORDER — OXYCODONE-ACETAMINOPHEN 5-325 MG PO TABS: 1.0000 | ORAL_TABLET | ORAL | Status: DC | PRN

## 2013-03-29 NOTE — ED Notes (Signed)
Pt resting quietly at the time. States 5/10 pain to L thigh. Drinking sprite and eating crackers at the time. Pt is alert and oriented x4. NAD.

## 2013-03-29 NOTE — ED Provider Notes (Signed)
CSN: [id number]     Arrival date & time 03/29/13  0441 History   First MD Initiated Contact with Patient 03/29/13 [phone]     Chief Complaint  Patient presents with  . Abscess   (Consider location/radiation/quality/duration/timing/severity/associated sxs/prior Treatment) Patient is a 42 y.o. female presenting with abscess. The history is provided by the patient. No language interpreter was used.  Abscess Location:  Leg Leg abscess location:  L upper leg Abscess quality: draining and painful   Red streaking: no   Associated symptoms: no fever and no nausea   Associated symptoms comment:  Draining abscess to left upper medial thigh. No fever. History of cutaneous abscess remotely, x 20 years.    Past Medical History  Diagnosis Date  . Vertigo   . Diverticulitis   . GERD (gastroesophageal reflux disease)   . Migraine    Past Surgical History  Procedure Laterality Date  . Tubal ligation    . Thyroidectomy  1995   Family History  Problem Relation Age of Onset  . Sudden death Neg Hx   . Hypertension Neg Hx   . Hyperlipidemia Neg Hx   . Heart attack Neg Hx   . Diabetes Neg Hx   . Colon cancer Mother    History  Substance Use Topics  . Smoking status: Current Some Day Smoker -- 0.03 packs/day for 6 years    Types: Cigarettes  . Smokeless tobacco: Never Used  . Alcohol Use: Yes     Comment: occasionally   OB History   Grav Para Term Preterm Abortions TAB SAB Ect Mult Living                 Review of Systems  Constitutional: Negative for fever and chills.  Gastrointestinal: Negative for nausea.  Musculoskeletal: Negative.   Skin:       C/O Abscess.    Allergies  Bee venom; Ibuprofen; and Shrimp  Home Medications   Current Outpatient Rx  Name  Route  Sig  Dispense  Refill  . acetaminophen (TYLENOL) 325 MG tablet   Oral   Take 650 mg by mouth every 6 (six) hours as needed for mild pain, moderate pain or headache.         . cyclobenzaprine (FLEXERIL) 10 MG  tablet   Oral   Take 1 tablet (10 mg total) by mouth 3 (three) times daily as needed for muscle spasms.   90 tablet   0   . meclizine (ANTIVERT) 25 MG tablet   Oral   Take 25 mg by mouth 3 (three) times daily as needed.         . traMADol (ULTRAM) 50 MG tablet   Oral   Take 1 tablet (50 mg total) by mouth every 6 (six) hours as needed for pain.   90 tablet   0    BP 115/59  Pulse 81  Temp(Src) 98.2 F (36.8 C) (Oral)  Ht 5' 1.5" (1.562 m)  Wt 168 lb (76.204 kg)  BMI 31.23 kg/m2  SpO2 100%  LMP 03/03/2013 Physical Exam  Constitutional: She is oriented to person, place, and time. She appears well-developed and well-nourished.  Neck: Normal range of motion.  Pulmonary/Chest: Effort normal.  Neurological: She is alert and oriented to person, place, and time.  Skin: Skin is warm and dry.  Large area of induration at inguinal fold of left thigh measuring 2 cm x 6 cm, anteromedially. No labial involvement. There is small amount of active drainage. Significantly tender. Minimal  erythema surrounding area.     ED Course  Procedures (including critical care time) Labs Review Labs Reviewed - No data to display Imaging Review No results found.  EKG Interpretation   None      INCISION AND DRAINAGE Performed by: Elpidio Anis A Consent: Verbal consent obtained. Risks and benefits: risks, benefits and alternatives were discussed Type: abscess  Body area: left thigh  Anesthesia: local infiltration  Incision was made with a scalpel.  Local anesthetic: lidocaine 1% w/o epinephrine  Anesthetic total: 2 ml  Complexity: complex Blunt dissection to break up loculations  Drainage: purulent  Drainage amount: moderate  Packing material: 1/4 in iodoform gauze  Patient tolerance: Patient tolerated the procedure well with no immediate complications.    MDM  No diagnosis found. 1. Cutaneous abscess, left thigh  Uncomplicated left thigh abscess with successful  I&D.    Arnoldo Hooker, PA-C 03/29/13 [phone]

## 2013-03-29 NOTE — ED Notes (Signed)
Pt reports painful, swollen abscess noted to left upper thigh/groin area, scant drainage noted. Pain 9/10. Pt denies fever/N/V/D.

## 2013-03-30 NOTE — ED Provider Notes (Signed)
Medical screening examination/treatment/procedure(s) were performed by non-physician practitioner and as supervising physician I was immediately available for consultation/collaboration.   Loren Racer, MD 03/30/13 [phone]

## 2013-03-31 ENCOUNTER — Encounter (HOSPITAL_COMMUNITY): Payer: Self-pay | Admitting: Emergency Medicine

## 2013-03-31 ENCOUNTER — Emergency Department (HOSPITAL_COMMUNITY)
Admission: EM | Admit: 2013-03-31 | Discharge: 2013-03-31 | Disposition: A | Discharge status: Home / Self Care | Payer: Medicaid Other | Attending: Emergency Medicine | Admitting: Emergency Medicine

## 2013-03-31 DIAGNOSIS — Z8679 Personal history of other diseases of the circulatory system: Secondary | ICD-10-CM | POA: Insufficient documentation

## 2013-03-31 DIAGNOSIS — F17200 Nicotine dependence, unspecified, uncomplicated: Secondary | ICD-10-CM | POA: Insufficient documentation

## 2013-03-31 DIAGNOSIS — R21 Rash and other nonspecific skin eruption: Secondary | ICD-10-CM | POA: Insufficient documentation

## 2013-03-31 DIAGNOSIS — L02219 Cutaneous abscess of trunk, unspecified: Secondary | ICD-10-CM | POA: Insufficient documentation

## 2013-03-31 DIAGNOSIS — Z8719 Personal history of other diseases of the digestive system: Secondary | ICD-10-CM | POA: Insufficient documentation

## 2013-03-31 DIAGNOSIS — L0390 Cellulitis, unspecified: Secondary | ICD-10-CM

## 2013-03-31 MED ORDER — OXYCODONE-ACETAMINOPHEN 5-325 MG PO TABS: 1.0000 | ORAL_TABLET | ORAL | Status: DC | PRN

## 2013-03-31 MED ORDER — CEPHALEXIN 500 MG PO CAPS: 500.0000 mg | ORAL_CAPSULE | Freq: Four times a day (QID) | ORAL | Status: DC

## 2013-03-31 NOTE — ED Notes (Signed)
PT ambulated with baseline gait; VSS; A&Ox3; no signs of distress; respirations even and unlabored; skin warm and dry; no questions upon discharge.  

## 2013-03-31 NOTE — ED Notes (Signed)
Pt had abcess L groin drained and packed here 2 days ago. Reports the packing fell out and the pain is getting worse so she wants to be rechecked.

## 2013-03-31 NOTE — ED Provider Notes (Signed)
CSN: [id number]     Arrival date & time 03/31/13  1740 History  This chart was scribed for non-physician practitioner Kristin Forth, PA, working with No att. providers found by Kristin Mcintyre, ED scribe. This patient was seen in room TR07C/TR07C and the patient's care was started at 9:43 PM.   Chief Complaint  Patient presents with  . Wound Infection   (Consider location/radiation/quality/duration/timing/severity/associated sxs/prior Treatment) The history is provided by the patient and medical records. No language interpreter was used.  HPI Comments: Kristin Mcintyre is a 42 y.o. female who was seen 2 days ago for L groin abscess with subsequent I&D with packing placement presents to the Emergency Department complaining of dislodgement of the packing yesterday morning. She reports the wound closed up and has not gotten any smaller in size.  She reports the pain is improved with Percocet, but she is concerned that the abscess is going to get bigger.  She reports she called the ED and spoke with a nurse who advised her to come back to the ED.  She denies fever, chills, headache, neck pain chest pain, shortness of breath, abdominal pain, nausea, vomiting, diarrhea, weakness, dizziness, syncope, dysuria, hematuria.   Past Medical History  Diagnosis Date  . Vertigo   . Diverticulitis   . GERD (gastroesophageal reflux disease)   . Migraine    Past Surgical History  Procedure Laterality Date  . Tubal ligation    . Thyroidectomy  1995   Family History  Problem Relation Age of Onset  . Sudden death Neg Hx   . Hypertension Neg Hx   . Hyperlipidemia Neg Hx   . Heart attack Neg Hx   . Diabetes Neg Hx   . Colon cancer Mother    History  Substance Use Topics  . Smoking status: Current Some Day Smoker -- 0.03 packs/day for 6 years    Types: Cigarettes  . Smokeless tobacco: Never Used  . Alcohol Use: Yes     Comment: occasionally   OB History   Grav Para Term Preterm Abortions TAB SAB  Ect Mult Living                 Review of Systems  Constitutional: Negative for fever and chills.  Respiratory: Negative for shortness of breath.   Cardiovascular: Negative for chest pain.  Gastrointestinal: Negative for nausea, vomiting and abdominal pain.  Musculoskeletal: Negative for back pain.  Skin: Positive for rash.  Allergic/Immunologic: Negative for immunocompromised state.  Neurological: Negative for dizziness and weakness.  Hematological: Does not bruise/bleed easily.  Psychiatric/Behavioral: The patient is not nervous/anxious.     Allergies  Bee venom; Ibuprofen; and Shrimp  Home Medications   Current Outpatient Rx  Name  Route  Sig  Dispense  Refill  . meclizine (ANTIVERT) 25 MG tablet   Oral   Take 25 mg by mouth 3 (three) times daily as needed.         . cephALEXin (KEFLEX) 500 MG capsule   Oral   Take 1 capsule (500 mg total) by mouth 4 (four) times daily.   40 capsule   0   . oxyCODONE-acetaminophen (PERCOCET/ROXICET) 5-325 MG per tablet   Oral   Take 1-2 tablets by mouth every 4 (four) hours as needed for severe pain.   15 tablet   0    BP 121/79  Pulse 105  Temp(Src) 98.5 F (36.9 C) (Oral)  Resp 18  SpO2 99%  LMP 03/03/2013 Physical Exam  Nursing note and  vitals reviewed. Constitutional: She is oriented to person, place, and time. She appears well-developed and well-nourished. No distress.  HENT:  Head: Normocephalic and atraumatic.  Eyes: Conjunctivae are normal. No scleral icterus.  Neck: Normal range of motion.  Cardiovascular: Regular rhythm, normal heart sounds and intact distal pulses.   No murmur heard. Mild tachycardia  Pulmonary/Chest: Effort normal and breath sounds normal. No respiratory distress. She has no wheezes.  Abdominal: Soft. Bowel sounds are normal. She exhibits no distension. There is no tenderness.  Lymphadenopathy:    She has no cervical adenopathy.  Neurological: She is alert and oriented to person, place,  and time. She exhibits normal muscle tone. Coordination normal.  Skin: Skin is warm and dry. No rash noted. She is not diaphoretic. There is erythema.  3x3cm area of erythema and induration to the left groin without palpable fluctuance; unable to express purulent drainage from the incision site; tenderness to palpation to the area  Psychiatric: She has a normal mood and affect. Her behavior is normal.    ED Course  Procedures (including critical care time)  DIAGNOSTIC STUDIES: Oxygen Saturation is 99% on room, normal by my interpretation.    COORDINATION OF CARE:  9:43 PM- Pt advised of plan for treatment and pt agrees.   Labs Review Labs Reviewed - No data to display Imaging Review No results found.  EKG Interpretation   None       MDM   1. Cellulitis      Kristin Mcintyre presents for re-check of her abscess 2 days after it was I&D.  Pt concerned by continued swelling and pain, but abscess looks no worse than initial documentation.  Patient is without fevers or chills.  No area of fluctuance noted on exam. I do not feel that further incision and drainage will benefit the patient at this time.  We'll begin her on Keflex.  She has no history of diabetes or other comorbidities to effect wound healing.  Discussed use of warm compresses and routine flushing as able to encourage drainage of the wound.  It has been determined that no acute conditions requiring further emergency intervention are present at this time. The patient/guardian have been advised of the diagnosis and plan. We have discussed signs and symptoms that warrant return to the ED, such as changes or worsening in symptoms.   Vital signs are stable at discharge.   BP 121/79  Pulse 105  Temp(Src) 98.5 F (36.9 C) (Oral)  Resp 18  SpO2 99%  LMP 03/03/2013  Patient/guardian has voiced understanding and agreed to follow-up with the PCP or specialist.      Kristin Forth, PA-C 03/31/13 2146

## 2013-04-04 NOTE — ED Provider Notes (Signed)
Medical screening examination/treatment/procedure(s) were performed by non-physician practitioner and as supervising physician I was immediately available for consultation/collaboration.  EKG Interpretation   None        Raeford Razor, MD 04/04/13 [phone]

## 2013-04-23 ENCOUNTER — Encounter (HOSPITAL_COMMUNITY): Payer: Self-pay | Admitting: Emergency Medicine

## 2013-04-23 DIAGNOSIS — R0789 Other chest pain: Secondary | ICD-10-CM | POA: Insufficient documentation

## 2013-04-23 DIAGNOSIS — Z8679 Personal history of other diseases of the circulatory system: Secondary | ICD-10-CM | POA: Insufficient documentation

## 2013-04-23 DIAGNOSIS — Z8719 Personal history of other diseases of the digestive system: Secondary | ICD-10-CM | POA: Insufficient documentation

## 2013-04-23 DIAGNOSIS — R51 Headache: Secondary | ICD-10-CM | POA: Insufficient documentation

## 2013-04-23 DIAGNOSIS — Z792 Long term (current) use of antibiotics: Secondary | ICD-10-CM | POA: Insufficient documentation

## 2013-04-23 DIAGNOSIS — F17200 Nicotine dependence, unspecified, uncomplicated: Secondary | ICD-10-CM | POA: Insufficient documentation

## 2013-04-23 DIAGNOSIS — R42 Dizziness and giddiness: Secondary | ICD-10-CM | POA: Insufficient documentation

## 2013-04-23 NOTE — ED Notes (Signed)
Pt states that she has been having intermittent CP for the past couple of days along with HA. Pt states she sees a neurologist and recently had an MRI for the HA. Pt states that when she had the CP yesterday the pain also radiated to her left arm.

## 2013-04-24 ENCOUNTER — Emergency Department (HOSPITAL_COMMUNITY)
Admission: EM | Admit: 2013-04-24 | Discharge: 2013-04-24 | Disposition: A | Discharge status: Home / Self Care | Payer: Medicaid Other | Attending: Emergency Medicine | Admitting: Emergency Medicine

## 2013-04-24 ENCOUNTER — Emergency Department (HOSPITAL_COMMUNITY): Payer: Medicaid Other

## 2013-04-24 DIAGNOSIS — R079 Chest pain, unspecified: Secondary | ICD-10-CM

## 2013-04-24 LAB — COMPREHENSIVE METABOLIC PANEL
ALT: 10 U/L (ref 0–35)
AST: 14 U/L (ref 0–37)
Albumin: 3.2 g/dL — ABNORMAL LOW (ref 3.5–5.2)
Calcium: 8.5 mg/dL (ref 8.4–10.5)
Creatinine, Ser: 0.9 mg/dL (ref 0.50–1.10)
Sodium: 137 mEq/L (ref 135–145)
Total Protein: 6.7 g/dL (ref 6.0–8.3)

## 2013-04-24 LAB — CBC WITH DIFFERENTIAL/PLATELET
Basophils Absolute: 0.1 10*3/uL (ref 0.0–0.1)
Basophils Relative: 1 % (ref 0–1)
Eosinophils Absolute: 0.2 10*3/uL (ref 0.0–0.7)
Eosinophils Relative: 2 % (ref 0–5)
Lymphocytes Relative: 50 % — ABNORMAL HIGH (ref 12–46)
MCH: 28.8 pg (ref 26.0–34.0)
MCHC: 33.4 g/dL (ref 30.0–36.0)
MCV: 86.1 fL (ref 78.0–100.0)
Platelets: 201 10*3/uL (ref 150–400)
RDW: 14.4 % (ref 11.5–15.5)
WBC: 9.2 10*3/uL (ref 4.0–10.5)

## 2013-04-24 LAB — TROPONIN I: Troponin I: <0.3 ng/mL (ref <0.30)

## 2013-04-24 MED ORDER — TRAMADOL HCL 50 MG PO TABS: 50.0000 mg | ORAL_TABLET | Freq: Four times a day (QID) | ORAL | Status: DC | PRN

## 2013-04-24 MED ORDER — DIPHENHYDRAMINE HCL 50 MG/ML IJ SOLN
12.5000 mg | Freq: Once | INTRAMUSCULAR | Status: AC
  Administered 2013-04-24: 12.5 mg via INTRAVENOUS
  Filled 2013-04-24: qty 1

## 2013-04-24 MED ORDER — METOCLOPRAMIDE HCL 5 MG/ML IJ SOLN
10.0000 mg | Freq: Once | INTRAMUSCULAR | Status: AC
  Administered 2013-04-24: 10 mg via INTRAVENOUS
  Filled 2013-04-24: qty 2

## 2013-04-24 MED ORDER — GI COCKTAIL ~~LOC~~
30.0000 mL | Freq: Once | ORAL | Status: AC
  Administered 2013-04-24: 30 mL via ORAL
  Filled 2013-04-24: qty 30

## 2013-04-24 MED ORDER — DEXAMETHASONE SODIUM PHOSPHATE 10 MG/ML IJ SOLN
10.0000 mg | Freq: Once | INTRAMUSCULAR | Status: AC
  Administered 2013-04-24: 10 mg via INTRAVENOUS
  Filled 2013-04-24: qty 1

## 2013-04-24 MED ORDER — FAMOTIDINE 20 MG PO TABS: 20.0000 mg | ORAL_TABLET | Freq: Two times a day (BID) | ORAL | Status: DC

## 2013-04-24 NOTE — ED Provider Notes (Signed)
CSN: [id number]     Arrival date & time 04/23/13  2339 History   First MD Initiated Contact with Patient 04/24/13 [phone]     Chief Complaint  Patient presents with  . Chest Pain  . Headache   (Consider location/radiation/quality/duration/timing/severity/associated sxs/prior Treatment) HPI Kristin Mcintyre is a 42 y.o. female who presents emergency department with multiple complaints. Patient states she is here mainly with headache and chest pain. States her headache began 4 days ago. She states it was gradual in onset, headache has been constant since then, frontal. States she does not have history of similar headaches in the past. States she sensitive to light and sound. States she feels dizzy as well, but states she does have history of vertigo. She's been taking meclizine, Tylenol, Excedrin Migraine with no relief. Patient states she also developed chest pain 2 days ago. States her chest pains are intermittent but didn't states that she has hot discard test paper last 2 days constantly. She states pain is retrosternal, radiating to the left arm. She denies any associated shortness of breath, nausea, dizziness. States pain is not worsened with exertion or eating. Nothing she has taken has helped her pain. She reports history of GERD for which she is not taking anything.     Past Medical History  Diagnosis Date  . Vertigo   . Diverticulitis   . GERD (gastroesophageal reflux disease)   . Migraine    Past Surgical History  Procedure Laterality Date  . Tubal ligation    . Thyroidectomy  1995   Family History  Problem Relation Age of Onset  . Sudden death Neg Hx   . Hypertension Neg Hx   . Hyperlipidemia Neg Hx   . Heart attack Neg Hx   . Diabetes Neg Hx   . Colon cancer Mother    History  Substance Use Topics  . Smoking status: Current Some Day Smoker -- 0.03 packs/day for 6 years    Types: Cigarettes  . Smokeless tobacco: Never Used  . Alcohol Use: Yes     Comment: occasionally    OB History   Grav Para Term Preterm Abortions TAB SAB Ect Mult Living                 Review of Systems  Constitutional: Negative for fever and chills.  HENT: Negative for congestion, ear pain and sore throat.   Respiratory: Positive for chest tightness. Negative for choking, shortness of breath and wheezing.   Cardiovascular: Positive for chest pain.  Gastrointestinal: Negative for nausea, vomiting, abdominal pain, diarrhea, constipation and blood in stool.  Genitourinary: Negative for dysuria and flank pain.  Neurological: Positive for dizziness and headaches. Negative for weakness and numbness.  All other systems reviewed and are negative.    Allergies  Bee venom; Shrimp; and Ibuprofen  Home Medications   Current Outpatient Rx  Name  Route  Sig  Dispense  Refill  . meclizine (ANTIVERT) 25 MG tablet   Oral   Take 25 mg by mouth 3 (three) times daily as needed.         . cephALEXin (KEFLEX) 500 MG capsule   Oral   Take 1 capsule (500 mg total) by mouth 4 (four) times daily.   40 capsule   0    BP 111/60  Pulse 77  Temp(Src) 97.8 F (36.6 C) (Oral)  Resp 20  Ht 5\' 1"  (1.549 m)  Wt 168 lb (76.204 kg)  BMI 31.76 kg/m2  SpO2 99%  LMP 04/02/2013 Physical Exam  Nursing note and vitals reviewed. Constitutional: She is oriented to person, place, and time. She appears well-developed and well-nourished. No distress.  HENT:  Head: Normocephalic.  Right Ear: External ear normal.  Left Ear: External ear normal.  Nose: Nose normal.  Mouth/Throat: Oropharynx is clear and moist.  Eyes: Conjunctivae and EOM are normal. Pupils are equal, round, and reactive to light.  Neck: Normal range of motion. Neck supple.  Cardiovascular: Normal rate, regular rhythm and normal heart sounds.   Pulmonary/Chest: Effort normal and breath sounds normal. No respiratory distress. She has no wheezes. She has no rales. She exhibits no tenderness.  Abdominal: Soft. Bowel sounds are normal.  She exhibits distension. There is no tenderness. There is no rebound.  Epigastric tenderness  Musculoskeletal: She exhibits no edema.  Neurological: She is alert and oriented to person, place, and time.  5/5 and equal upper and lower extremity strength bilaterally. Equal grip strength bilaterally. Normal finger to nose and heel to shin. No pronator drift.   Skin: Skin is warm and dry.  Psychiatric: She has a normal mood and affect. Her behavior is normal.    ED Course  Procedures (including critical care time) Labs Review Labs Reviewed  CBC WITH DIFFERENTIAL - Abnormal; Notable for the following:    Neutrophils Relative % 41 (*)    Lymphocytes Relative 50 (*)    Lymphs Abs 4.6 (*)    All other components within normal limits  COMPREHENSIVE METABOLIC PANEL - Abnormal; Notable for the following:    Albumin 3.2 (*)    Total Bilirubin 0.1 (*)    GFR calc non Af Amer 78 (*)    All other components within normal limits  TROPONIN I  TROPONIN I   Imaging Review Dg Chest 2 View  04/24/2013   CLINICAL DATA:  Chest pain, headache  EXAM: CHEST  2 VIEW  COMPARISON:  None.  FINDINGS: The heart size and mediastinal contours are within normal limits. Both lungs are clear. The visualized skeletal structures are unremarkable. Postop changes from thyroidectomy.  IMPRESSION: No active cardiopulmonary disease.   Electronically Signed   By: Ruel Favors M.D.   On: 04/24/2013 01:11    EKG Interpretation    Date/Time:  Saturday April 23 2013 23:46:30 EST Ventricular Rate:  76 PR Interval:  140 QRS Duration: 78 QT Interval:  362 QTC Calculation: 407 R Axis:   35 Text Interpretation:  Normal sinus rhythm Nonspecific ST abnormality No significant change since last tracing Confirmed by OPITZ  MD, BRIAN [phone]) on 04/24/2013 5:59:23 AM            MDM   1. Headache   2. Chest pain     8:15 AM Patients with a headache and atypical chest pains. Patient's chest pain improved with GI  cocktail. She does have history of GERD and is not taking any current medications. She did have some epigastric tenderness. I do suspect her chest pain may be related to this. I checked EKG, chest x-ray, troponin x2, all negative. She does not have any history of cardiac disease. I suspect she is low-risk. She will need outpatient followup for further evaluation. Her headache had improved with Reglan, Benadryl, Decadron emergency department. She does have history of migraines although she did not tolerate this at initial evaluation. She is feeling much better this time and wants to be discharged home. I instructed her to followup with primary care Dr. Janean Sark for pain at home. Pepcid for  acid reflux.  Filed Vitals:   04/24/13 0606 04/24/13 0615 04/24/13 0630 04/24/13 0645  BP:  105/60 108/61 111/60  Pulse: 70 78 68 77  Temp:      TempSrc:      Resp: 16 22 18 20   Height:      Weight:      SpO2: 98% 95% 98% 99%      Lottie Mussel, PA-C 04/24/13 [phone]

## 2013-04-25 NOTE — ED Provider Notes (Signed)
Medical screening examination/treatment/procedure(s) were performed by non-physician practitioner and as supervising physician I was immediately available for consultation/collaboration.   Anaelle Dunton, MD 04/25/13 0227 

## 2013-05-30 ENCOUNTER — Encounter: Payer: Self-pay | Admitting: Internal Medicine

## 2013-06-29 ENCOUNTER — Encounter: Payer: Self-pay | Admitting: Internal Medicine

## 2013-06-29 ENCOUNTER — Ambulatory Visit (INDEPENDENT_AMBULATORY_CARE_PROVIDER_SITE_OTHER): Payer: Medicaid Other | Admitting: Internal Medicine

## 2013-06-29 VITALS — BP 102/60 | HR 84 | Ht 64.0 in | Wt 210.1 lb

## 2013-06-29 DIAGNOSIS — R12 Heartburn: Secondary | ICD-10-CM

## 2013-06-29 DIAGNOSIS — Z800 Family history of malignant neoplasm of digestive organs: Secondary | ICD-10-CM

## 2013-06-29 DIAGNOSIS — K5900 Constipation, unspecified: Secondary | ICD-10-CM

## 2013-06-29 DIAGNOSIS — K625 Hemorrhage of anus and rectum: Secondary | ICD-10-CM

## 2013-06-29 DIAGNOSIS — R109 Unspecified abdominal pain: Secondary | ICD-10-CM

## 2013-06-29 DIAGNOSIS — R1010 Upper abdominal pain, unspecified: Secondary | ICD-10-CM

## 2013-06-29 MED ORDER — LINACLOTIDE 145 MCG PO CAPS: 145.0000 ug | ORAL_CAPSULE | Freq: Every day | ORAL | Status: DC

## 2013-06-29 MED ORDER — PANTOPRAZOLE SODIUM 40 MG PO TBEC: 40.0000 mg | DELAYED_RELEASE_TABLET | Freq: Every day | ORAL | Status: DC

## 2013-06-29 MED ORDER — MOVIPREP 100 G PO SOLR: ORAL | Status: DC

## 2013-06-29 MED ORDER — HYOSCYAMINE SULFATE 0.125 MG SL SUBL
0.1250 mg | SUBLINGUAL_TABLET | SUBLINGUAL | Status: DC | PRN
Start: 2013-06-29 — End: 2014-04-04

## 2013-06-29 NOTE — Progress Notes (Signed)
Patient ID: Kristin Mcintyre, female   DOB: 1970-11-15, 43 y.o.   MRN: [id number] HPI: Kristin Mcintyre is a 43 year old female with a past medical history of diverticulitis, GERD, and a family history of colon cancer who is seen in consultation at the request of Verdis Prime, PA-C to evaluate rectal bleeding and lower abdominal pain as well as heartburn. She is here alone today. The patient reports that she has a history of diverticulitis dating back to 2010. She's been treated with antibiotics on multiple occasions for, usually left sided abdominal pain. She also reports significant issues with constipation over the last several months though in years past she's had alternating diarrhea and constipation. She is now having to use laxatives to help induce bowel movement. She uses fleets enema and occasionally "colon cleanser". With laxatives she is having 2-3 bowel movements per week. She reports with her constipation she has lower abdominal cramping pain, back pain, headache, and "stomach pain". She does occasionally see red blood mixed with her stool and with wiping. No melena. She does report occasionally avoiding eating because it makes her abdominal discomfort worse. No fevers or chills. She endorses daily heartburn without dysphagia or odynophagia. Some nausea but no vomiting. She also reports "esophageal spasm" with chest pain worse with eating at times.  Family history significant for her mother diagnosed with stage IV colorectal cancer at age 44  She has never had a colonoscopy  Past Medical History  Diagnosis Date  . Vertigo   . Diverticulitis   . GERD (gastroesophageal reflux disease)   . Migraine   . Diverticulosis     Past Surgical History  Procedure Laterality Date  . Tubal ligation    . Thyroidectomy  1995  . Sweat gland removed Right     arm  . Umbilical hernia repair      age 62    Current Outpatient Prescriptions  Medication Sig Dispense Refill  . famotidine (PEPCID) 20  MG tablet Take 1 tablet (20 mg total) by mouth 2 (two) times daily.  30 tablet  0  . meclizine (ANTIVERT) 25 MG tablet Take 25 mg by mouth 3 (three) times daily as needed.      . hyoscyamine (LEVSIN/SL) 0.125 MG SL tablet Place 1 tablet (0.125 mg total) under the tongue every 4 (four) hours as needed.  30 tablet  0  . Linaclotide (LINZESS) 145 MCG CAPS capsule Take 1 capsule (145 mcg total) by mouth daily.  30 capsule  6  . MOVIPREP 100 G SOLR Use per prep instruction  1 kit  0  . pantoprazole (PROTONIX) 40 MG tablet Take 1 tablet (40 mg total) by mouth daily.  90 tablet  3   No current facility-administered medications for this visit.    Allergies  Allergen Reactions  . Bee Venom Anaphylaxis and Hives  . Shrimp [Shellfish Allergy] Anaphylaxis and Hives  . Ibuprofen Nausea Only    Family History  Problem Relation Age of Onset  . Sudden death Neg Hx   . Hyperlipidemia Neg Hx   . Heart attack Neg Hx   . Colon cancer Mother   . Diabetes Maternal Grandmother   . Hypertension Maternal Grandmother   . Stroke Maternal Grandmother   . Hypertension Maternal Grandfather   . Stroke Maternal Grandfather     History  Substance Use Topics  . Smoking status: Current Some Day Smoker -- 0.03 packs/day for 6 years    Types: Cigarettes  . Smokeless tobacco: Never Used  .  Alcohol Use: Yes     Comment: occasionally    ROS: As per history of present illness, otherwise negative  BP 102/60  Pulse 84  Ht _0  (1.626 m)  Wt 210 lb 2 oz (95.312 kg)  BMI 36.05 kg/m2  LMP 05/29/2013 Constitutional: Well-developed and well-nourished. No distress. HEENT: Normocephalic and atraumatic. Oropharynx is clear and moist. No oropharyngeal exudate. Conjunctivae are normal.  No scleral icterus. Neck: Neck supple. Trachea midline. Cardiovascular: Normal rate, regular rhythm and intact distal pulses. No M/R/G Pulmonary/chest: Effort normal and breath sounds normal. No wheezing, rales or  rhonchi. Abdominal: Soft, obese, mild lower tenderness without rebound or guarding, nondistended. Bowel sounds active throughout.  Extremities: no clubbing, cyanosis, or edema Lymphadenopathy: No cervical adenopathy noted. Neurological: Alert and oriented to person place and time. Skin: Skin is warm and dry. No rashes noted. Psychiatric: Normal mood and affect. Behavior is normal.  RELEVANT LABS AND IMAGING: CBC    Component Value Date/Time   WBC 9.2 04/23/2013 2359   RBC 4.24 04/23/2013 2359   HGB 12.2 04/23/2013 2359   HCT 36.5 04/23/2013 2359   PLT 201 04/23/2013 2359   MCV 86.1 04/23/2013 2359   MCH 28.8 04/23/2013 2359   MCHC 33.4 04/23/2013 2359   RDW 14.4 04/23/2013 2359   LYMPHSABS 4.6* 04/23/2013 2359   MONOABS 0.6 04/23/2013 2359   EOSABS 0.2 04/23/2013 2359   BASOSABS 0.1 04/23/2013 2359    CMP     Component Value Date/Time   NA 137 04/23/2013 2359   K 3.7 04/23/2013 2359   CL 102 04/23/2013 2359   CO2 28 04/23/2013 2359   GLUCOSE 92 04/23/2013 2359   BUN 15 04/23/2013 2359   CREATININE 0.90 04/23/2013 2359   CALCIUM 8.5 04/23/2013 2359   PROT 6.7 04/23/2013 2359   ALBUMIN 3.2* 04/23/2013 2359   AST 14 04/23/2013 2359   ALT 10 04/23/2013 2359   ALKPHOS 49 04/23/2013 2359   BILITOT 0.1* 04/23/2013 2359   GFRNONAA 78* 04/23/2013 2359   GFRAA >90 04/23/2013 2359    ASSESSMENT/PLAN: 43 year old female with a past medical history of diverticulitis, GERD, and a family history of colon cancer who is seen in consultation at the request of Verdis Prime, PA-C to evaluate rectal bleeding and lower abdominal pain as well as heartburn.  1.  Family history of colon cancer/constipation/lower abdominal pain -- I have recommended Linzess 145 mcg daily to help with constipation. I will give her prescription for Levsin to be used as needed and estimated for lower abdominal cramping pain. I recommended proceeding to colonoscopy at this time to evaluate her symptoms and  given her family history of colon cancer in first degree relative. We discussed colonoscopy including the risks and benefits and she is agreeable to proceed  2.  Heartburn/epigastric pain -she was previous he taking famotidine but ran out of this medication. She is taking no medicines now. I recommended pantoprazole 40 mg once daily, 30 minutes before breakfast.  Given her epigastric pain, we will perform upper endoscopy at the same time as her colonoscopy. We discussed this test including the risks and benefits and she is agreeable to proceed.  Further recommendations after procedures.

## 2013-06-29 NOTE — Patient Instructions (Addendum)
You have been given a separate informational sheet regarding your tobacco use, the importance of quitting and local resources to help you quit.   You have been scheduled for a colonoscopy/endoscopy with propofol. Please follow written instructions given to you at your visit today.  Please pick up your prep kit at the pharmacy within the next 1-3 days. If you use inhalers (even only as needed), please bring them with you on the day of your procedure. Your physician has requested that you go to www.startemmi.com and enter the access code given to you at your visit today. This web site gives a general overview about your procedure. However, you should still follow specific instructions given to you by our office regarding your preparation for the procedure.  We have sent the following medications to your pharmacy for you to pick up at your convenience: Linzess, levsin, pantoprazole, moviprep                                               We are excited to introduce MyChart, a new best-in-class service that provides you online access to important information in your electronic medical record. We want to make it easier for you to view your health information - all in one secure location - when and where you need it. We expect MyChart will enhance the quality of care and service we provide.  When you register for MyChart, you can:    View your test results.    Request appointments and receive appointment reminders via email.    Request medication renewals.    View your medical history, allergies, medications and immunizations.    Communicate with your physician's office through a password-protected site.    Conveniently print information such as your medication lists.  To find out if MyChart is right for you, please talk to a member of our clinical staff today. We will gladly answer your questions about this free health and wellness tool.  If you are age 43 or older and want a member of your  family to have access to your record, you must provide written consent by completing a proxy form available at our office. Please speak to our clinical staff about guidelines regarding accounts for patients younger than age 17.  As you activate your MyChart account and need any technical assistance, please call the MyChart technical support line at [phone] [phone]) or email your question to [email or web address]. If you email your question(s), please include your name, a return phone number and the best time to reach you.  If you have non-urgent health-related questions, you can send a message to our office through [place] at [email or web address]. If you have a medical emergency, call 911.  Thank you for using MyChart as your new health and wellness resource!   MyChart licensed from [employer],  1999-2010. Patents Pending.

## 2013-07-27 ENCOUNTER — Other Ambulatory Visit: Payer: Self-pay | Admitting: Internal Medicine

## 2013-07-27 ENCOUNTER — Ambulatory Visit (AMBULATORY_SURGERY_CENTER): Payer: Medicaid Other | Admitting: Internal Medicine

## 2013-07-27 ENCOUNTER — Encounter: Payer: Self-pay | Admitting: Internal Medicine

## 2013-07-27 VITALS — BP 114/74 | HR 59 | Temp 97.9°F | Resp 20 | Ht 64.0 in | Wt 210.0 lb

## 2013-07-27 DIAGNOSIS — K2940 Chronic atrophic gastritis without bleeding: Secondary | ICD-10-CM

## 2013-07-27 DIAGNOSIS — Z800 Family history of malignant neoplasm of digestive organs: Secondary | ICD-10-CM

## 2013-07-27 DIAGNOSIS — A048 Other specified bacterial intestinal infections: Secondary | ICD-10-CM

## 2013-07-27 DIAGNOSIS — Z1211 Encounter for screening for malignant neoplasm of colon: Secondary | ICD-10-CM

## 2013-07-27 DIAGNOSIS — D126 Benign neoplasm of colon, unspecified: Secondary | ICD-10-CM

## 2013-07-27 DIAGNOSIS — K2980 Duodenitis without bleeding: Secondary | ICD-10-CM

## 2013-07-27 DIAGNOSIS — R1013 Epigastric pain: Secondary | ICD-10-CM

## 2013-07-27 MED ORDER — SODIUM CHLORIDE 0.9 % IV SOLN: 500.0000 mL | INTRAVENOUS | Status: DC

## 2013-07-27 NOTE — Op Note (Signed)
[place]  [address]. [place], [id number]   COLONOSCOPY PROCEDURE REPORT  PATIENT: Kristin Mcintyre, Kristin Mcintyre  MR#: [id number] BIRTHDATE: December 31, 1970 , 42  yrs. old GENDER: Female ENDOSCOPIST: Jerene Bears, MD PROCEDURE DATE:  07/27/2013 PROCEDURE:   Colonoscopy with snare polypectomy First Screening Colonoscopy - Avg.  risk and is 50 yrs.  old or older - No.  Prior Negative Screening - Now for repeat screening. N/A  History of Adenoma - Now for follow-up colonoscopy & has been > or = to 3 yrs.  N/A  Polyps Removed Today? Yes. ASA CLASS:   Class II INDICATIONS:elevated risk screening, Patient's immediate family history of colon cancer, and first colonoscopy. MEDICATIONS: MAC sedation, administered by CRNA and propofol (Diprivan) 150mg  IV  DESCRIPTION OF PROCEDURE:   After the risks benefits and alternatives of the procedure were thoroughly explained, informed consent was obtained.  A digital rectal exam revealed no rectal mass.   The LB [id number]  endoscope was introduced through the anus and advanced to the cecum, which was identified by both the appendix and ileocecal valve. No adverse events experienced. The quality of the prep was good, using MoviPrep  The instrument was then slowly withdrawn as the colon was fully examined.   COLON FINDINGS: Two sessile polyps measuring 3 and 8 mm in size were found at the cecum and hepatic flexure.  Polypectomy was performed using cold snare and using hot snare.  All resections were complete and all polyp tissue was completely retrieved.   The colon mucosa was otherwise normal.  Retroflexed views revealed no abnormalities. The time to cecum=1 minutes 20 seconds.  Withdrawal time=11 minutes 52 seconds.  The scope was withdrawn and the procedure completed. COMPLICATIONS: There were no complications.  ENDOSCOPIC IMPRESSION: 1.   Two sessile polyps measuring 3 and 8 mm in size were found at the cecum and hepatic flexure;  Polypectomy was performed using cold snare and using hot snare 2.   The colon mucosa was otherwise normal  RECOMMENDATIONS: 1.  Hold aspirin, aspirin products, and anti-inflammatory medication for 2 weeks. 2.  Await pathology results 3.  Timing of repeat colonoscopy will be determined by pathology findings, but no longer than 5 years. 4.  You will receive a letter within 1-2 weeks with the results of your biopsy as well as final recommendations.  Please call my office if you have not received a letter after 3 weeks.   eSigned:  Jerene Bears, MD 07/27/2013 2:42 PM   cc: The Patient; Louis Meckel, MD

## 2013-07-27 NOTE — Progress Notes (Signed)
Report to pacu rn, vss, bbs=clear 

## 2013-07-27 NOTE — Progress Notes (Signed)
Patient denies any allergies to eggs or soy. Patient denies any problems with anesthesia.  

## 2013-07-27 NOTE — Op Note (Signed)
[place]  [address]. [place], [id number]   ENDOSCOPY PROCEDURE REPORT  PATIENT: Kristin Mcintyre, Kristin Mcintyre  MR#: [id number] BIRTHDATE: 11-Jan-1971 , 42  yrs. old GENDER: Female ENDOSCOPIST: Jerene Bears, MD PROCEDURE DATE:  07/27/2013 PROCEDURE:  EGD w/ biopsy for H.pylori ASA CLASS:     Class II INDICATIONS:  Epigastric pain.   history of GERD. MEDICATIONS: MAC sedation, administered by CRNA and propofol (Diprivan) 200mg  IV TOPICAL ANESTHETIC: Cetacaine Spray  DESCRIPTION OF PROCEDURE: After the risks benefits and alternatives of the procedure were thoroughly explained, informed consent was obtained.  The LB [id number] endoscope was introduced through the mouth and advanced to the second portion of the duodenum. Without limitations.  The instrument was slowly withdrawn as the mucosa was fully examined.     ESOPHAGUS: The mucosa of the esophagus and Z-line appeared normal.   STOMACH: The mucosa of the stomach appeared normal.  Biopsies were taken in the gastric body, antrum and angularis.  DUODENUM: Mild duodenal inflammation was found in the duodenal bulb. Normal appearing mucosa in the examined 2nd part of the duodenum. Retroflexed views revealed no abnormalities.     The scope was then withdrawn from the patient and the procedure completed.  COMPLICATIONS: There were no complications.  ENDOSCOPIC IMPRESSION: 1.   The mucosa of the esophagus appeared normal 2.   The mucosa of the stomach appeared normal; biopsies were taken in the antrum and angularis 3.   Mild bulbar duodenitis, normal second part of duodenum  RECOMMENDATIONS: 1.  Await biopsy results 2.  Follow-up of helicobacter pylori status, treat if indicated  eSigned:  Jerene Bears, MD 07/27/2013 2:38 PM   CC:The Patient; Louis Meckel, MD

## 2013-07-27 NOTE — Patient Instructions (Signed)
YOU HAD AN ENDOSCOPIC PROCEDURE TODAY AT THE [facility]: Refer to the procedure report that was given to you for any specific questions about what was found during the examination.  If the procedure report does not answer your questions, please call your gastroenterologist to clarify.  If you requested that your care partner not be given the details of your procedure findings, then the procedure report has been included in a sealed envelope for you to review at your convenience later.  YOU SHOULD EXPECT: Some feelings of bloating in the abdomen. Passage of more gas than usual.  Walking can help get rid of the air that was put into your GI tract during the procedure and reduce the bloating. If you had a lower endoscopy (such as a colonoscopy or flexible sigmoidoscopy) you may notice spotting of blood in your stool or on the toilet paper. If you underwent a bowel prep for your procedure, then you may not have a normal bowel movement for a few days.  DIET: Your first meal following the procedure should be a light meal and then it is ok to progress to your normal diet.  A half-sandwich or bowl of soup is an example of a good first meal.  Heavy or fried foods are harder to digest and may make you feel nauseous or bloated.  Likewise meals heavy in dairy and vegetables can cause extra gas to form and this can also increase the bloating.  Drink plenty of fluids but you should avoid alcoholic beverages for 24 hours.  ACTIVITY: Your care partner should take you home directly after the procedure.  You should plan to take it easy, moving slowly for the rest of the day.  You can resume normal activity the day after the procedure however you should NOT DRIVE or use heavy machinery for 24 hours (because of the sedation medicines used during the test).    SYMPTOMS TO REPORT IMMEDIATELY: A gastroenterologist can be reached at any hour.  During normal business hours, 8:30 AM to 5:00 PM Monday through Friday,  call [phone].  After hours and on weekends, please call the GI answering service at [phone] who will take a message and have the physician on call contact you.   Following lower endoscopy (colonoscopy or flexible sigmoidoscopy):  Excessive amounts of blood in the stool  Significant tenderness or worsening of abdominal pains  Swelling of the abdomen that is new, acute  Fever of 100F or higher  Following upper endoscopy (EGD)  Vomiting of blood or coffee ground material  New chest pain or pain under the shoulder blades  Painful or persistently difficult swallowing  New shortness of breath  Fever of 100F or higher  Black, tarry-looking stools  FOLLOW UP: If any biopsies were taken you will be contacted by phone or by letter within the next 1-3 weeks.  Call your gastroenterologist if you have not heard about the biopsies in 3 weeks.  Our staff will call the home number listed on your records the next business day following your procedure to check on you and address any questions or concerns that you may have at that time regarding the information given to you following your procedure. This is a courtesy call and so if there is no answer at the home number and we have not heard from you through the emergency physician on call, we will assume that you have returned to your regular daily activities without incident.  SIGNATURES/CONFIDENTIALITY: You and/or your care   partner have signed paperwork which will be entered into your electronic medical record.  These signatures attest to the fact that that the information above on your After Visit Summary has been reviewed and is understood.  Full responsibility of the confidentiality of this discharge information lies with you and/or your care-partner.  HOLD ASPIRIN, ASPIRIN CONTAINING PRODUCTS, AND NSAIDS (ADVIL, ALEVE, IBUPROFEN, MOTRIN) FOR 2 WEEKS- TYLENOL IS OK  Continue the rest of your medications  Please await pathology    Please read handout about polyps

## 2013-07-27 NOTE — Progress Notes (Signed)
Called to room to assist during endoscopic procedure.  Patient ID and intended procedure confirmed with present staff. Received instructions for my participation in the procedure from the performing physician.  

## 2013-07-27 NOTE — Progress Notes (Addendum)
1455- Pt states her right forearm, above her IV is "very painful.  It started hurting in the procedure room when they put my medicine in.  It's cold."  Right forearm is cool to touch but no swelling or redness noted.  I removed her IV and placed a warm pack to her arm.  1503- states arm discomfort is better  1517- no c/o pain at discharge

## 2013-07-28 ENCOUNTER — Telehealth: Payer: Self-pay

## 2013-07-28 NOTE — Telephone Encounter (Signed)
  Follow up Call-  Call back number 07/27/2013  Post procedure Call Back phone  # [phone]  Permission to leave phone message Yes     Patient questions:  Do you have a fever, pain , or abdominal swelling? no Pain Score  0 *  Have you tolerated food without any problems? yes  Have you been able to return to your normal activities? yes  Do you have any questions about your discharge instructions: Diet   no Medications  no Follow up visit  no  Do you have questions or concerns about your Care? no  Actions: * If pain score is 4 or above: No action needed, pain <4.

## 2013-08-07 ENCOUNTER — Encounter: Payer: Self-pay | Admitting: Internal Medicine

## 2013-08-08 ENCOUNTER — Telehealth: Payer: Self-pay | Admitting: Internal Medicine

## 2013-08-08 ENCOUNTER — Other Ambulatory Visit: Payer: Self-pay

## 2013-08-08 MED ORDER — BIS SUBCIT-METRONID-TETRACYC 140-125-125 MG PO CAPS: 3.0000 | ORAL_CAPSULE | Freq: Three times a day (TID) | ORAL | Status: DC

## 2013-08-08 NOTE — Telephone Encounter (Signed)
Ok for fluconazole 150 mg po X 1 only in the event of vaginal yeast infection associated with abx

## 2013-08-08 NOTE — Telephone Encounter (Signed)
Patient wants to have diflucan prescribed for possible yeast infection for Pylera.  Just sent in pylera rx this am.

## 2013-08-09 MED ORDER — FLUCONAZOLE 150 MG PO TABS: 150.0000 mg | ORAL_TABLET | Freq: Every day | ORAL | Status: DC

## 2013-08-09 NOTE — Telephone Encounter (Signed)
Patient notified

## 2013-09-28 ENCOUNTER — Emergency Department (HOSPITAL_COMMUNITY)
Admission: EM | Admit: 2013-09-28 | Discharge: 2013-09-28 | Disposition: A | Discharge status: Home / Self Care | Payer: Medicaid Other | Attending: Emergency Medicine | Admitting: Emergency Medicine

## 2013-09-28 ENCOUNTER — Encounter (HOSPITAL_COMMUNITY): Payer: Self-pay | Admitting: Emergency Medicine

## 2013-09-28 DIAGNOSIS — Z8679 Personal history of other diseases of the circulatory system: Secondary | ICD-10-CM | POA: Insufficient documentation

## 2013-09-28 DIAGNOSIS — M436 Torticollis: Secondary | ICD-10-CM | POA: Insufficient documentation

## 2013-09-28 DIAGNOSIS — K219 Gastro-esophageal reflux disease without esophagitis: Secondary | ICD-10-CM | POA: Insufficient documentation

## 2013-09-28 DIAGNOSIS — Z8719 Personal history of other diseases of the digestive system: Secondary | ICD-10-CM | POA: Insufficient documentation

## 2013-09-28 DIAGNOSIS — R42 Dizziness and giddiness: Secondary | ICD-10-CM | POA: Insufficient documentation

## 2013-09-28 DIAGNOSIS — F17200 Nicotine dependence, unspecified, uncomplicated: Secondary | ICD-10-CM | POA: Insufficient documentation

## 2013-09-28 DIAGNOSIS — M5412 Radiculopathy, cervical region: Secondary | ICD-10-CM

## 2013-09-28 MED ORDER — METHOCARBAMOL 500 MG PO TABS
1000.0000 mg | ORAL_TABLET | Freq: Once | ORAL | Status: AC
  Administered 2013-09-28: 1000 mg via ORAL
  Filled 2013-09-28: qty 2

## 2013-09-28 MED ORDER — METHOCARBAMOL 500 MG PO TABS: 1000.0000 mg | ORAL_TABLET | Freq: Four times a day (QID) | ORAL | Status: DC | PRN

## 2013-09-28 MED ORDER — HYDROCODONE-ACETAMINOPHEN 5-325 MG PO TABS
1.0000 | ORAL_TABLET | Freq: Once | ORAL | Status: AC
  Administered 2013-09-28: 1 via ORAL
  Filled 2013-09-28: qty 1

## 2013-09-28 MED ORDER — PREDNISONE 20 MG PO TABS
60.0000 mg | ORAL_TABLET | Freq: Once | ORAL | Status: AC
  Administered 2013-09-28: 60 mg via ORAL
  Filled 2013-09-28: qty 3

## 2013-09-28 MED ORDER — HYDROCODONE-ACETAMINOPHEN 5-325 MG PO TABS: ORAL_TABLET | ORAL | Status: DC

## 2013-09-28 MED ORDER — PREDNISONE 20 MG PO TABS: ORAL_TABLET | ORAL | Status: DC

## 2013-09-28 NOTE — Discharge Instructions (Signed)
Take vicodin for breakthrough pain, do not drink alcohol, drive, care for children or do other critical tasks while taking vicodin.  For breakthrough pain you may take Robaxin. Do not drink alcohol, drive or operate heavy machinery when taking Robaxin.  Please follow with your primary care doctor in the next 2 days for a check-up. They must obtain records for further management.   Do not hesitate to return to the Emergency Department for any new, worsening or concerning symptoms.   Cervical Radiculopathy Cervical radiculopathy happens when a nerve in the neck is pinched or bruised by a slipped (herniated) disk or by arthritic changes in the bones of the cervical spine. This can occur due to an injury or as part of the normal aging process. Pressure on the cervical nerves can cause pain or numbness that runs from your neck all the way down into your arm and fingers. CAUSES  There are many possible causes, including:  Injury.  Muscle tightness in the neck from overuse.  Swollen, painful joints (arthritis).  Breakdown or degeneration in the bones and joints of the spine (spondylosis) due to aging.  Bone spurs that may develop near the cervical nerves. SYMPTOMS  Symptoms include pain, weakness, or numbness in the affected arm and hand. Pain can be severe or irritating. Symptoms may be worse when extending or turning the neck. DIAGNOSIS  Your caregiver will ask about your symptoms and do a physical exam. He or she may test your strength and reflexes. X-rays, CT scans, and MRI scans may be needed in cases of injury or if the symptoms do not go away after a period of time. Electromyography (EMG) or nerve conduction testing may be done to study how your nerves and muscles are working. TREATMENT  Your caregiver may recommend certain exercises to help relieve your symptoms. Cervical radiculopathy can, and often does, get better with time and treatment. If your problems continue, treatment options  may include:  Wearing a soft collar for short periods of time.  Physical therapy to strengthen the neck muscles.  Medicines, such as nonsteroidal anti-inflammatory drugs (NSAIDs), oral corticosteroids, or spinal injections.  Surgery. Different types of surgery may be done depending on the cause of your problems. HOME CARE INSTRUCTIONS   Put ice on the affected area.  Put ice in a plastic bag.  Place a towel between your skin and the bag.  Leave the ice on for 15-20 minutes, 03-04 times a day or as directed by your caregiver.  If ice does not help, you can try using heat. Take a warm shower or bath, or use a hot water bottle as directed by your caregiver.  You may try a gentle neck and shoulder massage.  Use a flat pillow when you sleep.  Only take over-the-counter or prescription medicines for pain, discomfort, or fever as directed by your caregiver.  If physical therapy was prescribed, follow your caregiver's directions.  If a soft collar was prescribed, use it as directed. SEEK IMMEDIATE MEDICAL CARE IF:   Your pain gets much worse and cannot be controlled with medicines.  You have weakness or numbness in your hand, arm, face, or leg.  You have a high fever or a stiff, rigid neck.  You lose bowel or bladder control (incontinence).  You have trouble with walking, balance, or speaking. MAKE SURE YOU:   Understand these instructions.  Will watch your condition.  Will get help right away if you are not doing well or get worse. Document Released:  01/07/2001 Document Revised: 07/07/2011 Document Reviewed: 11/26/2010 Exit[employer] Patient Information 2014 [place].

## 2013-09-28 NOTE — ED Provider Notes (Signed)
CSN: [id number]     Arrival date & time 09/28/13  1658 History   First MD Initiated Contact with Patient 09/28/13 1745    This chart was scribed for non-physician practitioner, Monico Blitz, PA, working with Blanchard Kelch, MD by Terressa Koyanagi, ED Scribe. This patient was seen in room TR09C/TR09C and the patient's care was started at 6:19 PM.  Chief Complaint  Patient presents with  . Shoulder Pain   HPI HPI Comments: Kristin Mcintyre is a 43 y.o. female, with a history of left shoulder pain due to a cervical sprain (occurred last year), GERD, diverticulitis, who presents to the Emergency Department complaining of worsening, intermittent left shoulder pain that radiates to the left side of the neck and down her left arm onset 2 weeks ago. Pt reports that the pain is made worse with movement. Pt reports she had a particularly painful episode this morning where she was not able to move her neck when she woke up this morning. Pt denies any specific injury to the affected areas. Pt specifically denies chest pain, SOB, fever, chills. Pt reports numbness in her pointer finger and middle finger bilaterally. Pt denies being in a MVC or any trauma to the affected areas. Pt rates her pain a 9 out of 10. Pt reports she has a ride home. Pt reports an allergy to ibuprofen, specifically, it irritates her stomach.    Medical Hx: Pt reports that she was followed by Dr. Karlton Lemon, sports medicine, for the cervical sprain. Dr. Barbaraann Barthel: (1) did an MRI that showed no evidence of radiculopathy, (2) advised pt to continue HEP, (3) prescribed tramadol and flexeril for pain; and (4) referred to pt to neurology ([facility]) for evaluation of possible peripheral neuropathy or systemic cause of Sx. Pt reports that she followed up with neurology where she got an EMG.   Past Medical History  Diagnosis Date  . Vertigo   . Diverticulitis   . GERD (gastroesophageal reflux disease)   . Migraine   . Diverticulosis    Past  Surgical History  Procedure Laterality Date  . Tubal ligation    . Thyroidectomy  1995  . Sweat gland removed Right     arm  . Umbilical hernia repair      age 29   Family History  Problem Relation Age of Onset  . Sudden death Neg Hx   . Hyperlipidemia Neg Hx   . Heart attack Neg Hx   . Colon cancer Mother 57  . Diabetes Maternal Grandmother   . Hypertension Maternal Grandmother   . Stroke Maternal Grandmother   . Hypertension Maternal Grandfather   . Stroke Maternal Grandfather    History  Substance Use Topics  . Smoking status: Current Every Day Smoker -- 0.25 packs/day for 6 years    Types: Cigarettes  . Smokeless tobacco: Never Used  . Alcohol Use: Yes     Comment: occasionally   OB History   Grav Para Term Preterm Abortions TAB SAB Ect Mult Living                 Review of Systems  Musculoskeletal:       Left shoulder pain radiating to left side of neck and down left arm  All other systems reviewed and are negative.   A complete 10 system review of systems was obtained and all systems are negative except as noted in the HPI and PMH.    Allergies  Bee venom; Shrimp; and Ibuprofen  Home Medications   Prior to Admission medications   Medication Sig Start Date End Date Taking? Authorizing Provider  acetaminophen-codeine (TYLENOL #3) 300-30 MG per tablet Take 1 tablet by mouth every 4 (four) hours as needed for moderate pain.    Historical Provider, MD  bismuth-metronidazole-tetracycline [facility]) [id number] MG per capsule Take 3 capsules by mouth 4 (four) times daily -  before meals and at bedtime. 08/08/13   Jerene Bears, MD  fluconazole (DIFLUCAN) 150 MG tablet Take 1 tablet (150 mg total) by mouth daily. 08/09/13   Jerene Bears, MD  hyoscyamine (LEVSIN/SL) 0.125 MG SL tablet Place 1 tablet (0.125 mg total) under the tongue every 4 (four) hours as needed. 06/29/13   Jerene Bears, MD  Linaclotide (LINZESS) 145 MCG CAPS capsule Take 1 capsule (145 mcg total) by  mouth daily. 06/29/13   Jerene Bears, MD  meclizine (ANTIVERT) 25 MG tablet Take 25 mg by mouth 3 (three) times daily as needed.    Historical Provider, MD  pantoprazole (PROTONIX) 40 MG tablet Take 1 tablet (40 mg total) by mouth daily. 06/29/13   Jerene Bears, MD   Triage Vitals: BP 112/61  Pulse 87  Temp(Src) 97.8 F (36.6 C) (Oral)  Resp 18  Ht 5\' 2"  (1.575 m)  Wt 186 lb (84.369 kg)  BMI 34.01 kg/m2  SpO2 98%  LMP 09/19/2013 Physical Exam  Nursing note and vitals reviewed. Constitutional: She is oriented to person, place, and time. She appears well-developed and well-nourished. No distress.  HENT:  Head: Normocephalic and atraumatic.  Eyes: Conjunctivae and EOM are normal. Pupils are equal, round, and reactive to light.  Neck: Normal range of motion. Neck supple. No tracheal deviation present.  No midline C-spine  tenderness to palpation or step-offs appreciated. Patient has full range of motion without pain.   Cardiovascular: Normal rate.   Pulmonary/Chest: Effort normal and breath sounds normal. No respiratory distress. She has no wheezes. She has no rales. She exhibits no tenderness.  Abdominal: Soft. Bowel sounds are normal. She exhibits no distension and no mass. There is no tenderness. There is no rebound and no guarding.  Musculoskeletal: Normal range of motion. She exhibits no edema.  Neurological: She is alert and oriented to person, place, and time.  Strength is 5 out of 5 bilateral upper extremities, sensation intact to pinprick and light touch.  Skin: Skin is warm and dry.  Psychiatric: She has a normal mood and affect. Her behavior is normal.    ED Course  Procedures (including critical care time) DIAGNOSTIC STUDIES: Oxygen Saturation is 98% on RA, normal by my interpretation.    COORDINATION OF CARE:  6:23 PM-Discussed treatment plan which includes f/u with Dr. Karlton Lemon and meds with pt at bedside and pt agreed to plan. Pt advised to update her PCP with her  visit to the ED today.   Labs Review Labs Reviewed - No data to display  Imaging Review No results found.   EKG Interpretation None      MDM   Final diagnoses:  Torticollis  Cervical radicular pain    Filed Vitals:   09/28/13 1707  BP: 112/61  Pulse: 87  Temp: 97.8 F (36.6 C)  TempSrc: Oral  Resp: 18  Height: 5\' 2"  (1.575 m)  Weight: 186 lb (84.369 kg)  SpO2: 98%    Medications  methocarbamol (ROBAXIN) tablet 1,000 mg (1,000 mg Oral Given 09/28/13 1900)  predniSONE (DELTASONE) tablet 60 mg (60 mg Oral Given  09/28/13 1900)  HYDROcodone-acetaminophen (NORCO/VICODIN) 5-325 MG per tablet 1 tablet (1 tablet Oral Given 09/28/13 1901)    Kristin Mcintyre is a 43 y.o. female presenting with left shoulder pain and radiculopathy. Normal neuro exam. We'll treat with anti-inflammatories, muscle relaxers and narcotics. Eyes follow closely with orthopedist  Evaluation does not show pathology that would require ongoing emergent intervention or inpatient treatment. Pt is hemodynamically stable and mentating appropriately. Discussed findings and plan with patient/guardian, who agrees with care plan. All questions answered. Return precautions discussed and outpatient follow up given.   Discharge Medication List as of 09/28/2013  6:33 PM    START taking these medications   Details  HYDROcodone-acetaminophen (NORCO/VICODIN) 5-325 MG per tablet Take 1-2 tablets by mouth every 6 hours as needed for pain., Print    methocarbamol (ROBAXIN) 500 MG tablet Take 2 tablets (1,000 mg total) by mouth 4 (four) times daily as needed (Pain)., Starting 09/28/2013, Until Discontinued, Print    predniSONE (DELTASONE) 20 MG tablet 3 tabs po daily x 3 days, then 2 tabs x 3 days, then 1.5 tabs x 3 days, then 1 tab x 3 days, then 0.5 tabs x 3 days, Print           I personally performed the services described in this documentation, which was scribed in my presence. The recorded information has been reviewed  and is accurate.    Monico Blitz, PA-C 09/29/13 [phone]

## 2013-09-28 NOTE — ED Notes (Addendum)
Pt reports left shoulder pain that radiates to left side of neck x 2 weeks; worse with movement. States hx of pain in left shoulder dt cervical sprain and this feels the same. Denies recent injury. Denies fever/chills. Denies CP, SOB. Denies taking anything for pain. NAD.

## 2013-09-29 NOTE — ED Provider Notes (Signed)
Medical screening examination/treatment/procedure(s) were performed by non-physician practitioner and as supervising physician I was immediately available for consultation/collaboration.   EKG Interpretation None        Blanchard Kelch, MD 09/29/13 1051

## 2013-12-23 ENCOUNTER — Encounter (HOSPITAL_COMMUNITY): Payer: Self-pay | Admitting: Emergency Medicine

## 2013-12-23 ENCOUNTER — Emergency Department (HOSPITAL_COMMUNITY)
Admission: EM | Admit: 2013-12-23 | Discharge: 2013-12-24 | Disposition: A | Discharge status: Home / Self Care | Payer: Medicaid Other | Attending: Emergency Medicine | Admitting: Emergency Medicine

## 2013-12-23 DIAGNOSIS — F17200 Nicotine dependence, unspecified, uncomplicated: Secondary | ICD-10-CM | POA: Diagnosis not present

## 2013-12-23 DIAGNOSIS — R51 Headache: Secondary | ICD-10-CM

## 2013-12-23 DIAGNOSIS — K219 Gastro-esophageal reflux disease without esophagitis: Secondary | ICD-10-CM | POA: Insufficient documentation

## 2013-12-23 DIAGNOSIS — IMO0002 Reserved for concepts with insufficient information to code with codable children: Secondary | ICD-10-CM | POA: Diagnosis not present

## 2013-12-23 DIAGNOSIS — G43909 Migraine, unspecified, not intractable, without status migrainosus: Secondary | ICD-10-CM | POA: Diagnosis not present

## 2013-12-23 DIAGNOSIS — Z79899 Other long term (current) drug therapy: Secondary | ICD-10-CM | POA: Diagnosis not present

## 2013-12-23 DIAGNOSIS — N390 Urinary tract infection, site not specified: Secondary | ICD-10-CM | POA: Insufficient documentation

## 2013-12-23 DIAGNOSIS — Z3202 Encounter for pregnancy test, result negative: Secondary | ICD-10-CM | POA: Insufficient documentation

## 2013-12-23 DIAGNOSIS — K5900 Constipation, unspecified: Secondary | ICD-10-CM | POA: Insufficient documentation

## 2013-12-23 DIAGNOSIS — R519 Headache, unspecified: Secondary | ICD-10-CM

## 2013-12-23 LAB — URINALYSIS, ROUTINE W REFLEX MICROSCOPIC
BILIRUBIN URINE: NEGATIVE
Glucose, UA: NEGATIVE mg/dL
Ketones, ur: NEGATIVE mg/dL
Nitrite: POSITIVE — AB
PROTEIN: NEGATIVE mg/dL
Specific Gravity, Urine: 1.022 (ref 1.005–1.030)
UROBILINOGEN UA: 1 mg/dL (ref 0.0–1.0)
pH: 6 (ref 5.0–8.0)

## 2013-12-23 LAB — COMPREHENSIVE METABOLIC PANEL
ALK PHOS: 53 U/L (ref 39–117)
ALT: 13 U/L (ref 0–35)
AST: 16 U/L (ref 0–37)
Albumin: 3.7 g/dL (ref 3.5–5.2)
Anion gap: 12 (ref 5–15)
BUN: 11 mg/dL (ref 6–23)
CALCIUM: 9.2 mg/dL (ref 8.4–10.5)
CHLORIDE: 103 meq/L (ref 96–112)
CO2: 26 meq/L (ref 19–32)
Creatinine, Ser: 0.86 mg/dL (ref 0.50–1.10)
GFR calc Af Amer: >90 mL/min (ref >90)
GFR, EST NON AFRICAN AMERICAN: 82 mL/min — AB (ref >90)
GLUCOSE: 87 mg/dL (ref 70–99)
POTASSIUM: 4.1 meq/L (ref 3.7–5.3)
SODIUM: 141 meq/L (ref 137–147)
Total Bilirubin: <0.2 mg/dL — ABNORMAL LOW (ref 0.3–1.2)
Total Protein: 7.3 g/dL (ref 6.0–8.3)

## 2013-12-23 LAB — CBC WITH DIFFERENTIAL/PLATELET
Basophils Absolute: 0.1 10*3/uL (ref 0.0–0.1)
Basophils Relative: 1 % (ref 0–1)
EOS PCT: 2 % (ref 0–5)
Eosinophils Absolute: 0.2 10*3/uL (ref 0.0–0.7)
HCT: 38.5 % (ref 36.0–46.0)
Hemoglobin: 12.3 g/dL (ref 12.0–15.0)
LYMPHS ABS: 4.5 10*3/uL — AB (ref 0.7–4.0)
LYMPHS PCT: 57 % — AB (ref 12–46)
MCH: 27.3 pg (ref 26.0–34.0)
MCHC: 31.9 g/dL (ref 30.0–36.0)
MCV: 85.4 fL (ref 78.0–100.0)
Monocytes Absolute: 0.4 10*3/uL (ref 0.1–1.0)
Monocytes Relative: 5 % (ref 3–12)
Neutro Abs: 2.7 10*3/uL (ref 1.7–7.7)
Neutrophils Relative %: 35 % — ABNORMAL LOW (ref 43–77)
PLATELETS: 214 10*3/uL (ref 150–400)
RBC: 4.51 MIL/uL (ref 3.87–5.11)
RDW: 15 % (ref 11.5–15.5)
WBC: 7.8 10*3/uL (ref 4.0–10.5)

## 2013-12-23 LAB — URINE MICROSCOPIC-ADD ON

## 2013-12-23 LAB — POC URINE PREG, ED: Preg Test, Ur: NEGATIVE

## 2013-12-23 MED ORDER — DIPHENHYDRAMINE HCL 50 MG/ML IJ SOLN
50.0000 mg | Freq: Once | INTRAMUSCULAR | Status: AC
  Administered 2013-12-23: 50 mg via INTRAVENOUS
  Filled 2013-12-23: qty 1

## 2013-12-23 MED ORDER — ONDANSETRON HCL 4 MG/2ML IJ SOLN
4.0000 mg | Freq: Once | INTRAMUSCULAR | Status: AC
  Administered 2013-12-24: 4 mg via INTRAVENOUS
  Filled 2013-12-23: qty 2

## 2013-12-23 MED ORDER — KETOROLAC TROMETHAMINE 30 MG/ML IJ SOLN
30.0000 mg | Freq: Once | INTRAMUSCULAR | Status: AC
  Administered 2013-12-23: 30 mg via INTRAVENOUS
  Filled 2013-12-23: qty 1

## 2013-12-23 MED ORDER — METOCLOPRAMIDE HCL 5 MG/ML IJ SOLN
10.0000 mg | Freq: Once | INTRAMUSCULAR | Status: AC
  Administered 2013-12-23: 10 mg via INTRAVENOUS
  Filled 2013-12-23: qty 2

## 2013-12-23 NOTE — ED Provider Notes (Signed)
CSN: [id number]     Arrival date & time 12/23/13  1719 History   First MD Initiated Contact with Patient 12/23/13 2149     Chief Complaint  Patient presents with  . Migraine  . Constipation     (Consider location/radiation/quality/duration/timing/severity/associated sxs/prior Treatment) HPI Kristin Mcintyre is a 43 y.o. female with a history of chronic migraine, diverticulitis, vertigo, GERD comes in for evaluation of headache and abdominal discomfort. She states her headache feels like a migraine she's had in the past and has been going on for 3 days. She has tried Excedrin Migraine with no relief. She localizes the pain to her for head, behind her head, and behind her eyes. She reports feeling nauseous and says she is sensitive to light. She had a colonoscopy and EGD done in April and both were essentially normal. Her mother was diagnosed with colon cancer at 46. She reports chronic abdominal discomfort and is not new for her as she has had diverticulitis, H. pylori for some time now. She reports she is not having full bowel movements and that she will only pass small pebble-like stools. She denies any bloody stools or dark tarry stools. She has been passing gas throughout the day She denies fevers, chills, shortness of breath, chest pain, vomiting, dysuria, dizziness or weakness  Past Medical History  Diagnosis Date  . Vertigo   . Diverticulitis   . GERD (gastroesophageal reflux disease)   . Migraine   . Diverticulosis    Past Surgical History  Procedure Laterality Date  . Tubal ligation    . Thyroidectomy  1995  . Sweat gland removed Right     arm  . Umbilical hernia repair      age 27   Family History  Problem Relation Age of Onset  . Sudden death Neg Hx   . Hyperlipidemia Neg Hx   . Heart attack Neg Hx   . Colon cancer Mother 13  . Diabetes Maternal Grandmother   . Hypertension Maternal Grandmother   . Stroke Maternal Grandmother   . Hypertension Maternal Grandfather    . Stroke Maternal Grandfather    History  Substance Use Topics  . Smoking status: Current Every Day Smoker -- 0.25 packs/day for 6 years    Types: Cigarettes  . Smokeless tobacco: Never Used  . Alcohol Use: Yes     Comment: occasionally   OB History   Grav Para Term Preterm Abortions TAB SAB Ect Mult Living                 Review of Systems  Constitutional: Negative for fever.  HENT: Negative for congestion.   Eyes: Positive for photophobia.  Respiratory: Negative for shortness of breath.   Cardiovascular: Negative for chest pain.  Gastrointestinal: Positive for constipation.  Genitourinary: Negative for dysuria.  Musculoskeletal: Negative for neck pain.  Skin: Negative for rash.  Neurological: Positive for headaches.      Allergies  Bee venom; Shrimp; and Ibuprofen  Home Medications   Prior to Admission medications   Medication Sig Start Date End Date Taking? Authorizing Provider  acetaminophen-codeine (TYLENOL #3) 300-30 MG per tablet Take 1 tablet by mouth every 4 (four) hours as needed for moderate pain.    Historical Provider, MD  bismuth-metronidazole-tetracycline [facility]) [phone] MG per capsule Take 3 capsules by mouth 4 (four) times daily -  before meals and at bedtime. 08/08/13   Jerene Bears, MD  fluconazole (DIFLUCAN) 150 MG tablet Take 1 tablet (150 mg total)  by mouth daily. 08/09/13   Jerene Bears, MD  HYDROcodone-acetaminophen (NORCO/VICODIN) 5-325 MG per tablet Take 1-2 tablets by mouth every 6 hours as needed for pain. 09/28/13   Nicole Pisciotta, PA-C  hyoscyamine (LEVSIN/SL) 0.125 MG SL tablet Place 1 tablet (0.125 mg total) under the tongue every 4 (four) hours as needed. 06/29/13   Jerene Bears, MD  Linaclotide (LINZESS) 145 MCG CAPS capsule Take 1 capsule (145 mcg total) by mouth daily. 06/29/13   Jerene Bears, MD  meclizine (ANTIVERT) 25 MG tablet Take 25 mg by mouth 3 (three) times daily as needed.    Historical Provider, MD  meclizine (ANTIVERT) 50 MG  tablet Take 1 tablet (50 mg total) by mouth 3 (three) times daily as needed. 12/24/13   Viona Gilmore Neasia Fleeman, PA-C  methocarbamol (ROBAXIN) 500 MG tablet Take 2 tablets (1,000 mg total) by mouth 4 (four) times daily as needed (Pain). 09/28/13   Nicole Pisciotta, PA-C  metoCLOPramide (REGLAN) 10 MG tablet Take 1 tablet (10 mg total) by mouth every 6 (six) hours. 12/24/13   Viona Gilmore Reda Citron, PA-C  pantoprazole (PROTONIX) 40 MG tablet Take 1 tablet (40 mg total) by mouth daily. 06/29/13   Jerene Bears, MD  predniSONE (DELTASONE) 20 MG tablet 3 tabs po daily x 3 days, then 2 tabs x 3 days, then 1.5 tabs x 3 days, then 1 tab x 3 days, then 0.5 tabs x 3 days 09/28/13   Elmyra Ricks Pisciotta, PA-C  sulfamethoxazole-trimethoprim (BACTRIM DS,SEPTRA DS) 800-160 MG per tablet Take 1 tablet by mouth 2 (two) times daily. 12/24/13 12/31/13  Viona Gilmore Avrielle Fry, PA-C   BP 106/70  Pulse 67  Temp(Src) 97.8 F (36.6 C) (Oral)  Resp 16  SpO2 100%  LMP 12/13/2013 Physical Exam  Nursing note and vitals reviewed. Constitutional: She appears well-developed and well-nourished. No distress.  Awake, alert, nontoxic appearance.  HENT:  Head: Normocephalic and atraumatic.  Eyes: Conjunctivae and EOM are normal. Right eye exhibits no discharge. Left eye exhibits no discharge.  Neck: Neck supple.  Cardiovascular: Normal rate, regular rhythm and normal heart sounds.   Pulmonary/Chest: Effort normal. She exhibits no tenderness.  Abdominal: Soft. Bowel sounds are normal. There is no tenderness. There is no rebound and no guarding.  Musculoskeletal: She exhibits no tenderness.  Baseline ROM, no obvious new focal weakness.  Neurological:  Mental status and motor strength appears baseline for patient and situation.  Skin: Skin is warm and dry. No rash noted. She is not diaphoretic.  Psychiatric: She has a normal mood and affect.    ED Course  Procedures (including critical care time) Labs Review Labs Reviewed  CBC WITH DIFFERENTIAL  - Abnormal; Notable for the following:    Neutrophils Relative % 35 (*)    Lymphocytes Relative 57 (*)    Lymphs Abs 4.5 (*)    All other components within normal limits  COMPREHENSIVE METABOLIC PANEL - Abnormal; Notable for the following:    Total Bilirubin <0.2 (*)    GFR calc non Af Amer 82 (*)    All other components within normal limits  URINALYSIS, ROUTINE W REFLEX MICROSCOPIC - Abnormal; Notable for the following:    APPearance TURBID (*)    Hgb urine dipstick SMALL (*)    Nitrite POSITIVE (*)    Leukocytes, UA MODERATE (*)    All other components within normal limits  URINE MICROSCOPIC-ADD ON - Abnormal; Notable for the following:    Squamous Epithelial / LPF MANY (*)  Bacteria, UA MANY (*)    All other components within normal limits  POC URINE PREG, ED    Imaging Review No results found.   EKG Interpretation None     Meds given in ED:  Medications  diphenhydrAMINE (BENADRYL) injection 50 mg (50 mg Intravenous Given 12/23/13 2258)  metoCLOPramide (REGLAN) injection 10 mg (10 mg Intravenous Given 12/23/13 2258)  ketorolac (TORADOL) 30 MG/ML injection 30 mg (30 mg Intravenous Given 12/23/13 2258)  ondansetron (ZOFRAN) injection 4 mg (4 mg Intravenous Given 12/24/13 0104)    New Prescriptions   MECLIZINE (ANTIVERT) 50 MG TABLET    Take 1 tablet (50 mg total) by mouth 3 (three) times daily as needed.   METOCLOPRAMIDE (REGLAN) 10 MG TABLET    Take 1 tablet (10 mg total) by mouth every 6 (six) hours.   SULFAMETHOXAZOLE-TRIMETHOPRIM (BACTRIM DS,SEPTRA DS) 800-160 MG PER TABLET    Take 1 tablet by mouth 2 (two) times daily.   Filed Vitals:   12/23/13 1736 12/23/13 2245 12/24/13 0106  BP: 108/64 108/69 106/70  Pulse: 78 76 67  Temp: 98.2 F (36.8 C) 98.2 F (36.8 C) 97.8 F (36.6 C)  TempSrc: Oral Oral Oral  Resp: 16 18 16   SpO2: 98% 100% 100%    MDM  Vitals stable - WNL -afebrile Pt resting comfortably in ED. Migraine cocktail provided some relief. No  Meningeal Signs, no concern for SAH at this time. Clinical picture consistent with Primary tension type HA. No LLQ tenderness, no bloody stool, no fever, -Diverticulitis less likely. No sign of surgical abdomen. Labwork consistent with UTI, will trxt empirically Passing gas, bowel sounds present- obstruction/impaction less likely. Discussed Bowel regimen with Miralax/ increased fiber, pt said she had some at home and will take it regularly Will DC with Reglan, Bactrim, and refill Meclizine for HA prevention.  Discussed f/u with PCP and return precautions, pt very amenable to plan.   Final diagnoses:  Nonintractable headache, unspecified chronicity pattern, unspecified headache type  Constipation, unspecified constipation type  UTI (lower urinary tract infection)  Prior to patient discharge, I discussed and reviewed this case with Dr.Steinl         Verl Dicker, PA-C 12/24/13 0109  Verl Dicker, PA-C 12/24/13 0110

## 2013-12-23 NOTE — ED Notes (Signed)
Pt states hx of migraines.  Has had migraine since Tuesday with blurred vision, dizziness.  Pt also states nausea and vomiting.  Pt states hasn't had bm for 3 days.

## 2013-12-24 MED ORDER — MECLIZINE HCL 50 MG PO TABS: 50.0000 mg | ORAL_TABLET | Freq: Three times a day (TID) | ORAL | Status: DC | PRN

## 2013-12-24 MED ORDER — METOCLOPRAMIDE HCL 10 MG PO TABS: 10.0000 mg | ORAL_TABLET | Freq: Four times a day (QID) | ORAL | Status: DC

## 2013-12-24 MED ORDER — SULFAMETHOXAZOLE-TRIMETHOPRIM 800-160 MG PO TABS: 1.0000 | ORAL_TABLET | Freq: Two times a day (BID) | ORAL | Status: AC

## 2013-12-24 NOTE — Discharge Instructions (Signed)
Follow up with your PCP for further evaluation and management of your headache. Continue with Excedrin Migraine and Reglan for treatment of headache. Keep regularly scheduled appt with your Gastroenterologist Take TMP-SMX (antibiotic) for your UTI- 1 pill 2x daily for 7 days. Take until gone. Return to ED for further evaluation if you experience fevers, or headache gets worse

## 2013-12-24 NOTE — ED Notes (Signed)
Patient complaining of headache since Tuesday with nausea, dizziness, blurred vision, and vomiting.

## 2013-12-24 NOTE — ED Provider Notes (Signed)
Medical screening examination/treatment/procedure(s) were conducted as a shared visit with non-physician practitioner(s) and myself.  I personally evaluated the patient during the encounter.  Pt c/o hx migraines, c/o gradual onset one of her normal headaches in the past 2-3 days. Mild at onset and gradual in onset without abrupt change. No neck stiffness or rigidity on exam. No eye pain or change in vision. No numbness/weakness, or loss of normal functional ability. Afeb. Improved w meds.    Mirna Mires, MD 12/24/13 1539

## 2014-01-08 ENCOUNTER — Emergency Department (HOSPITAL_COMMUNITY)
Admission: EM | Admit: 2014-01-08 | Discharge: 2014-01-08 | Disposition: A | Discharge status: Home / Self Care | Payer: Medicaid Other | Attending: Emergency Medicine | Admitting: Emergency Medicine

## 2014-01-08 ENCOUNTER — Encounter (HOSPITAL_COMMUNITY): Payer: Self-pay | Admitting: Emergency Medicine

## 2014-01-08 ENCOUNTER — Emergency Department (HOSPITAL_COMMUNITY): Payer: Medicaid Other

## 2014-01-08 DIAGNOSIS — IMO0002 Reserved for concepts with insufficient information to code with codable children: Secondary | ICD-10-CM | POA: Insufficient documentation

## 2014-01-08 DIAGNOSIS — M79671 Pain in right foot: Secondary | ICD-10-CM

## 2014-01-08 DIAGNOSIS — G43909 Migraine, unspecified, not intractable, without status migrainosus: Secondary | ICD-10-CM | POA: Diagnosis not present

## 2014-01-08 DIAGNOSIS — Z79899 Other long term (current) drug therapy: Secondary | ICD-10-CM | POA: Diagnosis not present

## 2014-01-08 DIAGNOSIS — F17200 Nicotine dependence, unspecified, uncomplicated: Secondary | ICD-10-CM | POA: Insufficient documentation

## 2014-01-08 DIAGNOSIS — K219 Gastro-esophageal reflux disease without esophagitis: Secondary | ICD-10-CM | POA: Insufficient documentation

## 2014-01-08 DIAGNOSIS — M79609 Pain in unspecified limb: Secondary | ICD-10-CM | POA: Diagnosis not present

## 2014-01-08 MED ORDER — TRAMADOL HCL 50 MG PO TABS: 50.0000 mg | ORAL_TABLET | Freq: Four times a day (QID) | ORAL | Status: DC | PRN

## 2014-01-08 NOTE — ED Provider Notes (Signed)
Medical screening examination/treatment/procedure(s) were performed by non-physician practitioner and as supervising physician I was immediately available for consultation/collaboration.   EKG Interpretation None        Evelina Bucy, MD 01/08/14 2317

## 2014-01-08 NOTE — Discharge Instructions (Signed)
Read the information below.  Use the prescribed medication as directed.  Please discuss all new medications with your pharmacist.  You may return to the Emergency Department at any time for worsening condition or any new symptoms that concern you.   If you develop uncontrolled pain, weakness or numbness of the extremity, severe discoloration of the skin, or you are unable to walk, return to the ER for a recheck.      Musculoskeletal Pain Musculoskeletal pain is muscle and boney aches and pains. These pains can occur in any part of the body. Your caregiver may treat you without knowing the cause of the pain. They may treat you if blood or urine tests, X-rays, and other tests were normal.  CAUSES There is often not a definite cause or reason for these pains. These pains may be caused by a type of germ (virus). The discomfort may also come from overuse. Overuse includes working out too hard when your body is not fit. Boney aches also come from weather changes. Bone is sensitive to atmospheric pressure changes. HOME CARE INSTRUCTIONS   Ask when your test results will be ready. Make sure you get your test results.  Only take over-the-counter or prescription medicines for pain, discomfort, or fever as directed by your caregiver. If you were given medications for your condition, do not drive, operate machinery or power tools, or sign legal documents for 24 hours. Do not drink alcohol. Do not take sleeping pills or other medications that may interfere with treatment.  Continue all activities unless the activities cause more pain. When the pain lessens, slowly resume normal activities. Gradually increase the intensity and duration of the activities or exercise.  During periods of severe pain, bed rest may be helpful. Lay or sit in any position that is comfortable.  Putting ice on the injured area.  Put ice in a bag.  Place a towel between your skin and the bag.  Leave the ice on for 15 to 20 minutes, 3  to 4 times a day.  Follow up with your caregiver for continued problems and no reason can be found for the pain. If the pain becomes worse or does not go away, it may be necessary to repeat tests or do additional testing. Your caregiver may need to look further for a possible cause. SEEK IMMEDIATE MEDICAL CARE IF:  You have pain that is getting worse and is not relieved by medications.  You develop chest pain that is associated with shortness or breath, sweating, feeling sick to your stomach (nauseous), or throw up (vomit).  Your pain becomes localized to the abdomen.  You develop any new symptoms that seem different or that concern you. MAKE SURE YOU:   Understand these instructions.  Will watch your condition.  Will get help right away if you are not doing well or get worse. Document Released: 04/14/2005 Document Revised: 07/07/2011 Document Reviewed: 12/17/2012 [facility] Patient Information 2015 [place], [place]. This information is not intended to replace advice given to you by your health care provider. Make sure you discuss any questions you have with your health care provider.

## 2014-01-08 NOTE — ED Provider Notes (Signed)
CSN: [id number]     Arrival date & time 01/08/14  42 History   First MD Initiated Contact with Patient 01/08/14 1813     This chart was scribed for non-physician practitioner working with Evelina Bucy, MD by Forrestine Him, ED Scribe. This patient was seen in room TR06C/TR06C and the patient's care was started at 6:32 PM.   Chief Complaint  Patient presents with  . Foot Pain   The history is provided by the patient. No language interpreter was used.    HPI Comments: Kristin Mcintyre is a 43 y.o. female who presents to the Emergency Department complaining of constant, moderate R foot pain x 3 days that has progressively worsened. She also reports some numbness to the R 5th toe. She denies any recent injury or trauma. However, she admits to doing a lot of walking while at work; especially within the last week. Pain is exacerbated with weight bearing, certain positions, and ambulation. No alleviating factors at this time. Has tried tylenol without relief. She denies fever or chills. No weakness to lower extremity or foot. She denies any new shoes. Pt with known allergies to Bee venom and Ibuprofen. No other concerns this visit.  Past Medical History  Diagnosis Date  . Vertigo   . Diverticulitis   . GERD (gastroesophageal reflux disease)   . Migraine   . Diverticulosis    Past Surgical History  Procedure Laterality Date  . Tubal ligation    . Thyroidectomy  1995  . Sweat gland removed Right     arm  . Umbilical hernia repair      age 45   Family History  Problem Relation Age of Onset  . Sudden death Neg Hx   . Hyperlipidemia Neg Hx   . Heart attack Neg Hx   . Colon cancer Mother 51  . Diabetes Maternal Grandmother   . Hypertension Maternal Grandmother   . Stroke Maternal Grandmother   . Hypertension Maternal Grandfather   . Stroke Maternal Grandfather    History  Substance Use Topics  . Smoking status: Current Every Day Smoker -- 0.25 packs/day for 6 years    Types:  Cigarettes  . Smokeless tobacco: Never Used  . Alcohol Use: Yes     Comment: occasionally   OB History   Grav Para Term Preterm Abortions TAB SAB Ect Mult Living                 Review of Systems  Constitutional: Negative for fever and chills.  Musculoskeletal: Positive for arthralgias.  Neurological: Positive for numbness. Negative for weakness.  All other systems reviewed and are negative.     Allergies  Bee venom; Shrimp; and Ibuprofen  Home Medications   Prior to Admission medications   Medication Sig Start Date End Date Taking? Authorizing Provider  acetaminophen-codeine (TYLENOL #3) 300-30 MG per tablet Take 1 tablet by mouth every 4 (four) hours as needed for moderate pain.    Historical Provider, MD  bismuth-metronidazole-tetracycline [facility]) [phone] MG per capsule Take 3 capsules by mouth 4 (four) times daily -  before meals and at bedtime. 08/08/13   Jerene Bears, MD  fluconazole (DIFLUCAN) 150 MG tablet Take 1 tablet (150 mg total) by mouth daily. 08/09/13   Jerene Bears, MD  HYDROcodone-acetaminophen (NORCO/VICODIN) 5-325 MG per tablet Take 1-2 tablets by mouth every 6 hours as needed for pain. 09/28/13   Nicole Pisciotta, PA-C  hyoscyamine (LEVSIN/SL) 0.125 MG SL tablet Place 1 tablet (0.125 mg  total) under the tongue every 4 (four) hours as needed. 06/29/13   Jerene Bears, MD  Linaclotide (LINZESS) 145 MCG CAPS capsule Take 1 capsule (145 mcg total) by mouth daily. 06/29/13   Jerene Bears, MD  meclizine (ANTIVERT) 25 MG tablet Take 25 mg by mouth 3 (three) times daily as needed.    Historical Provider, MD  meclizine (ANTIVERT) 50 MG tablet Take 1 tablet (50 mg total) by mouth 3 (three) times daily as needed. 12/24/13   Viona Gilmore Cartner, PA-C  methocarbamol (ROBAXIN) 500 MG tablet Take 2 tablets (1,000 mg total) by mouth 4 (four) times daily as needed (Pain). 09/28/13   Nicole Pisciotta, PA-C  metoCLOPramide (REGLAN) 10 MG tablet Take 1 tablet (10 mg total) by mouth every  6 (six) hours. 12/24/13   Viona Gilmore Cartner, PA-C  pantoprazole (PROTONIX) 40 MG tablet Take 1 tablet (40 mg total) by mouth daily. 06/29/13   Jerene Bears, MD  predniSONE (DELTASONE) 20 MG tablet 3 tabs po daily x 3 days, then 2 tabs x 3 days, then 1.5 tabs x 3 days, then 1 tab x 3 days, then 0.5 tabs x 3 days 09/28/13   Monico Blitz, PA-C   Triage Vitals: BP 120/63  Pulse 74  Temp(Src) 98 F (36.7 C) (Oral)  Resp 16  SpO2 100%  LMP 12/13/2013   Physical Exam  Nursing note and vitals reviewed. Constitutional: She appears well-developed and well-nourished. No distress.  HENT:  Head: Normocephalic and atraumatic.  Neck: Neck supple.  Pulmonary/Chest: Effort normal.  Musculoskeletal:  Bony prominence laterally at the 5th MTP tenderness to palpation  No warmth or edema Full active ROM of digits 1-4 Pt does not move 5th toe Decreased sensation to 5th digit No other  tenderness Of note: Bony prominence is symmetric with L foot  Neurological: She is alert.  Skin: She is not diaphoretic.    ED Course  Procedures (including critical care time)  DIAGNOSTIC STUDIES: Oxygen Saturation is 100% on RA, Normal by my interpretation.    COORDINATION OF CARE: 6:37 PM- Will order DG foot Complete R. Discussed treatment plan with pt at bedside and pt agreed to plan.     Labs Review Labs Reviewed - No data to display  Imaging Review Dg Foot Complete Right  01/08/2014   CLINICAL DATA:  Right foot swelling.  EXAM: RIGHT FOOT COMPLETE - 3+ VIEW  COMPARISON:  None.  FINDINGS: There is no evidence of fracture or dislocation. There is no evidence of arthropathy or other focal bone abnormality. Mild diffuse soft tissue swelling.  IMPRESSION: Negative.   Electronically Signed   By: Kerby Moors M.D.   On: 01/08/2014 19:29     EKG Interpretation None      MDM   Final diagnoses:  Right foot pain    Afebrile, nontoxic patient with right foot pain over 5th MTP.  Appears to be irritated,  perhaps from pressure on bony prominence from standing long hours at job.  Doubt gout, septic joint.   D/C home with PCP, podiatry follow up, ultram.  Discussed result, findings, treatment, and follow up  with patient.  Pt given return precautions.  Pt verbalizes understanding and agrees with plan.       I personally performed the services described in this documentation, which was scribed in my presence. The recorded information has been reviewed and is accurate.    Clayton Bibles, PA-C 01/08/14 1948

## 2014-01-08 NOTE — ED Notes (Signed)
Onset 01-05-14 lateral right foot pain near little toe.  Edges of toes are numb.  Today pt is unable to bear hardly any weight on right foot.  No injuries noted.

## 2014-01-10 ENCOUNTER — Emergency Department (HOSPITAL_COMMUNITY)
Admission: EM | Admit: 2014-01-10 | Discharge: 2014-01-10 | Disposition: A | Discharge status: Home / Self Care | Payer: Medicaid Other | Attending: Emergency Medicine | Admitting: Emergency Medicine

## 2014-01-10 ENCOUNTER — Encounter (HOSPITAL_COMMUNITY): Payer: Self-pay | Admitting: Emergency Medicine

## 2014-01-10 ENCOUNTER — Emergency Department (HOSPITAL_COMMUNITY): Payer: Medicaid Other

## 2014-01-10 DIAGNOSIS — Z7982 Long term (current) use of aspirin: Secondary | ICD-10-CM | POA: Diagnosis not present

## 2014-01-10 DIAGNOSIS — F17200 Nicotine dependence, unspecified, uncomplicated: Secondary | ICD-10-CM | POA: Diagnosis not present

## 2014-01-10 DIAGNOSIS — Z79899 Other long term (current) drug therapy: Secondary | ICD-10-CM | POA: Insufficient documentation

## 2014-01-10 DIAGNOSIS — K219 Gastro-esophageal reflux disease without esophagitis: Secondary | ICD-10-CM | POA: Insufficient documentation

## 2014-01-10 DIAGNOSIS — G43909 Migraine, unspecified, not intractable, without status migrainosus: Secondary | ICD-10-CM | POA: Insufficient documentation

## 2014-01-10 DIAGNOSIS — M109 Gout, unspecified: Secondary | ICD-10-CM

## 2014-01-10 DIAGNOSIS — M79609 Pain in unspecified limb: Secondary | ICD-10-CM | POA: Insufficient documentation

## 2014-01-10 MED ORDER — PREDNISONE 10 MG PO TABS: 20.0000 mg | ORAL_TABLET | Freq: Every day | ORAL | Status: DC

## 2014-01-10 MED ORDER — HYDROCODONE-ACETAMINOPHEN 5-325 MG PO TABS: 1.0000 | ORAL_TABLET | ORAL | Status: DC | PRN

## 2014-01-10 NOTE — ED Notes (Addendum)
Pt reports swelling and pain in r/side of r/foot. Pain located in r/foot-4th and fifth toes. Area swollen . Pt denies trauma Pt was seen 2 days ago at [facility]. Xray completed on 9/13

## 2014-01-10 NOTE — Discharge Instructions (Signed)

## 2014-01-10 NOTE — ED Provider Notes (Signed)
CSN: [id number]     Arrival date & time 01/10/14  1110 History  This chart was scribed for non-physician practitioner, Kristin Haring, PA-C, working with Kristin Jacobsen, MD, by Kristin Mcintyre, ED Scribe. This patient was seen in room [facility] and the patient's care was started at 12:43 PM.    Chief Complaint  Patient presents with  . Foot Pain    5 day hx of swollen r/foot and oain  . Foot Swelling     HPI  HPI Comments: Kristin Mcintyre is a 43 y.o. female who presents to the Emergency Department complaining of constant, burning right foot pain onset 5 days ago. 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 Pain thurday. There is associated swelling. The pain worsened with ambulation and states keeping her foot elevated causes pain in other areas (hip and leg). Patient was seen 2 days ago and . She states she was given Tramodol with no relief. She denies personal or family history of gout or DM. She denies any injury or trauma. She reports it is painful to touch even lightly. No hx of gout. Pain is a burning and sometimes sharp sensation.   Past Medical History  Diagnosis Date  . Vertigo   . Diverticulitis   . GERD (gastroesophageal reflux disease)   . Migraine   . Diverticulosis    Past Surgical History  Procedure Laterality Date  . Tubal ligation    . Thyroidectomy  1995  . Sweat gland removed Right     arm  . Umbilical hernia repair      age 39   Family History  Problem Relation Age of Onset  . Sudden death Neg Hx   . Hyperlipidemia Neg Hx   . Heart attack Neg Hx   . Colon cancer Mother 34  . Diabetes Maternal Grandmother   . Hypertension Maternal Grandmother   . Stroke Maternal Grandmother   . Hypertension Maternal Grandfather   . Stroke Maternal Grandfather    History  Substance Use Topics  . Smoking status: Current Every Day Smoker -- 0.25 packs/day for 6 years    Types: Cigarettes  . Smokeless  tobacco: Never Used  . Alcohol Use: Yes     Comment: occasionally   OB History   Grav Para Term Preterm Abortions TAB SAB Ect Mult Living                 Review of Systems   Review of Systems  Gen: no weight loss, fevers, chills, night sweats  Eyes: no occular draining, occular pain,  No visual changes  Nose: no epistaxis or rhinorrhea  Mouth: no dental pain, no sore throat  Neck: no neck pain  Lungs: No hemoptysis. No wheezing or coughing CV:  No palpitations, dependent edema or orthopnea. No chest pain Abd: no diarrhea. No nausea or vomiting, No abdominal pain  GU: no dysuria or gross hematuria  MSK:  No muscle weakness, No muscular pain, + right foot pain Neuro: no headache, no focal neurologic deficits  Skin: no rash , no wounds Psyche: no complaints of depression or anxiety    Allergies  Bee venom; Shrimp; and Ibuprofen  Home Medications   Prior to Admission medications   Medication Sig Start Date End Date Taking? Authorizing Provider  aspirin-acetaminophen-caffeine (EXCEDRIN MIGRAINE) [phone] MG per tablet Take 2 tablets by mouth every 6 (six) hours as needed for headache.   Yes Historical Provider, MD  hyoscyamine (LEVSIN/SL) 0.125 MG SL tablet Place 1 tablet (0.125 mg total) under the tongue every 4 (four) hours as needed. 06/29/13  Yes Kristin Bears, MD  Kristin (LINZESS) 145 MCG CAPS capsule Take 1 capsule (145 mcg total) by mouth daily. 06/29/13  Yes Kristin Bears, MD  meclizine (ANTIVERT) 25 MG tablet Take 25 mg by mouth 3 (three) times daily as needed.   Yes Historical Provider, MD  pantoprazole (PROTONIX) 40 MG tablet Take 40 mg by mouth daily.   Yes Historical Provider, MD  traMADol (ULTRAM) 50 MG tablet Take 1 tablet (50 mg total) by mouth every 6 (six) hours as needed for moderate pain or severe pain. 01/08/14  Yes Kristin Bibles, PA-C  HYDROcodone-acetaminophen (NORCO/VICODIN) 5-325 MG per tablet Take 1-2 tablets by mouth every 4 (four) hours as needed. 01/10/14    Kristin Papania Marilu Favre, PA-C  metoCLOPramide (REGLAN) 10 MG tablet Take 1 tablet (10 mg total) by mouth every 6 (six) hours. 12/24/13   Kristin Gilmore Cartner, PA-C  predniSONE (DELTASONE) 10 MG tablet Take 2 tablets (20 mg total) by mouth daily. 01/10/14   Kristin Mako, PA-C   Triage Vitals: BP 100/47  Pulse 81  Temp(Src) 98.5 F (36.9 C) (Oral)  Resp 18  Wt 187 lb (84.823 kg)  SpO2 100%  LMP 01/08/2014  Physical Exam  Nursing note and vitals reviewed. Constitutional: She appears well-developed and well-nourished. No distress.  HENT:  Head: Normocephalic and atraumatic.  Eyes: Pupils are equal, round, and reactive to light.  Neck: Normal range of motion. Neck supple.  Cardiovascular: Normal rate and regular rhythm.   Pulmonary/Chest: Effort normal.  Abdominal: Soft.  Musculoskeletal:       Right foot: She exhibits tenderness (to light palpation) and swelling (mild). She exhibits normal range of motion, no bony tenderness, normal capillary refill, no crepitus, no deformity and no laceration.  Induration, no crepitance. Strong pedal pulses that are symmetrical  Neurological: She is alert.  Skin: Skin is warm and dry.    ED Course  Procedures (including critical care time)  DIAGNOSTIC STUDIES: Oxygen Saturation is 100% on room air, normal by my interpretation.    COORDINATION OF CARE: At 4332 Discussed treatment plan with patient which includes crutches and post-op boot. Patient agrees.   Labs Review Labs Reviewed - No data to display  Imaging Review Dg Foot Complete Right  01/08/2014   CLINICAL DATA:  Right foot swelling.  EXAM: RIGHT FOOT COMPLETE - 3+ VIEW  COMPARISON:  None.  FINDINGS: There is no evidence of fracture or dislocation. There is no evidence of arthropathy or other focal bone abnormality. Mild diffuse soft tissue swelling.  IMPRESSION: Negative.   Electronically Signed   By: Kerby Moors M.D.   On: 01/08/2014 19:29     EKG Interpretation None      MDM    Final diagnoses:  Acute gout of right foot, unspecified cause    The severity of patients pain is concerning for potential gout vs boney bruise. She has pain even at rest and with light palpation. She has a referral to Podiatry pending that was sent by her PCP (without face to face examination). Will treat her with pain medication and steroids and have her follow-up with podiatry. No sign of infection, skin is soft with strong pulses.  43 y.o.Tykia Mellone Jay's evaluation in the Emergency Department is complete. It has been determined that no acute conditions requiring further emergency intervention are present at this time. The  patient/guardian have been advised of the diagnosis and plan. We have discussed signs and symptoms that warrant return to the ED, such as changes or worsening in symptoms.  Vital signs are stable at discharge. Filed Vitals:   01/10/14 1148  BP: 100/47  Pulse: 81  Temp: 98.5 F (36.9 C)  Resp: 18    Patient/guardian has voiced understanding and agreed to follow-up with the PCP or specialist.   I personally performed the services described in this documentation, which was scribed in my presence. The recorded information has been reviewed and is accurate.    Kristin Mako, PA-C 01/10/14 1258

## 2014-01-10 NOTE — ED Provider Notes (Signed)
Medical screening examination/treatment/procedure(s) were performed by non-physician practitioner and as supervising physician I was immediately available for consultation/collaboration.  Leota Jacobsen, MD 01/10/14 [phone]

## 2014-01-30 ENCOUNTER — Encounter: Payer: Self-pay | Admitting: Internal Medicine

## 2014-01-30 ENCOUNTER — Ambulatory Visit: Payer: Medicaid Other | Admitting: Internal Medicine

## 2014-01-30 NOTE — Progress Notes (Signed)
Patient ID: REY FORS, female   DOB: 08/07/1970, 43 y.o.   MRN: [id number]   The patient's chart has been reviewed by Dr. Hilarie Fredrickson  and the recommendations are noted below.  No follow up necessary Follow-up urgent.  Locate patient immediately. Follow-up necessary. Contact patient and schedule visit in____weeks. Follow-up advised. Contact patient and schedule visit in ____ weeks.  Outcome of communication with the patient:   Patient missed her appointment please make her aware by letter, advise her to make followup it if needed   Patient is aware and has rescheduled her appointment   RMS

## 2014-04-04 ENCOUNTER — Ambulatory Visit (INDEPENDENT_AMBULATORY_CARE_PROVIDER_SITE_OTHER): Payer: Medicaid Other | Admitting: Internal Medicine

## 2014-04-04 ENCOUNTER — Other Ambulatory Visit: Payer: Medicaid Other

## 2014-04-04 ENCOUNTER — Encounter: Payer: Self-pay | Admitting: Internal Medicine

## 2014-04-04 VITALS — BP 110/72 | HR 76 | Ht 64.0 in | Wt 200.2 lb

## 2014-04-04 DIAGNOSIS — K2990 Gastroduodenitis, unspecified, without bleeding: Secondary | ICD-10-CM

## 2014-04-04 DIAGNOSIS — K589 Irritable bowel syndrome without diarrhea: Secondary | ICD-10-CM | POA: Insufficient documentation

## 2014-04-04 DIAGNOSIS — Z86010 Personal history of colonic polyps: Secondary | ICD-10-CM

## 2014-04-04 DIAGNOSIS — Z8619 Personal history of other infectious and parasitic diseases: Secondary | ICD-10-CM

## 2014-04-04 DIAGNOSIS — K2970 Gastritis, unspecified, without bleeding: Secondary | ICD-10-CM

## 2014-04-04 DIAGNOSIS — D126 Benign neoplasm of colon, unspecified: Secondary | ICD-10-CM

## 2014-04-04 DIAGNOSIS — K219 Gastro-esophageal reflux disease without esophagitis: Secondary | ICD-10-CM

## 2014-04-04 MED ORDER — PANTOPRAZOLE SODIUM 40 MG PO TBEC: 40.0000 mg | DELAYED_RELEASE_TABLET | Freq: Every day | ORAL | Status: AC

## 2014-04-04 NOTE — Progress Notes (Signed)
Subjective:    Patient ID: Kristin Mcintyre, female    DOB: Oct 03, 1970, 43 y.o.   MRN: [id number]  HPI Kristin Mcintyre is a 43 yo female with PMH of H. pylori gastritis, GERD, adenomatous colon polyps who is seen for follow-up. She is here today with her son. She was seen initially in March 2015 to evaluate lower abdominal pain and rectal bleeding as well as heartburn. She came for upper endoscopy and colonoscopy performed on 07/27/2013. The upper endoscopy showed mild bulbar duodenitis was otherwise unremarkable. Biopsies showed severe Helicobacter pylori gastritis without metaplasia or dysplasia. Colonoscopy to the cecum revealed 2 polyps from the cecum and hepatic flexure which were removed with snare. These were found to be tubular adenoma without high-grade dysplasia. She completed H. pylori therapy with Pylera and is currently taking no medication.  She does report continued heartburn. This is on most days of the week but is not associated with dysphagia or odynophagia. Appetite fluctuates and reports some days very good appetite and can eat 3 meals a day and other days she only drinks coffee. She works at [employer]. Her weight has fluctuated somewhat as well. She is not having significant nausea or vomiting. She is no longer taking pantoprazole. Bowel movements have been somewhat erratic and inconsistent. No blood in her stool or melena. She reports some day she can have 2-3 loose stools and on other days have completely normal formed stool. She can also skip days and go 2-3 days without having a bowel movement. He does have some gas and abdominal bloating symptom when she has not had a complete bowel movement. No fevers or chills. When she was initially seen constipation was more predominant. Family history is notable for a mother with colorectal cancer  Review of Systems As per history of present illness, otherwise negative  Current Medications, Allergies, Past Medical History, Past Surgical  History, Family History and Social History were reviewed in [facility] record.     Objective:   Physical Exam BP 110/72 mmHg  Pulse 76  Ht 5\' 4"  (1.626 m)  Wt 200 lb 4 oz (90.833 kg)  BMI 34.36 kg/m2  LMP 02/22/2014 Constitutional: Well-developed and well-nourished. No distress. HEENT: Normocephalic and atraumatic. Conjunctivae are normal.  No scleral icterus. Neck: Neck supple. Trachea midline. Cardiovascular: Normal rate, regular rhythm and intact distal pulses. No M/R/G Pulmonary/chest: Effort normal and breath sounds normal. No wheezing, rales or rhonchi. Abdominal: Soft, nontender, nondistended. Bowel sounds active throughout. There are no masses palpable. No hepatosplenomegaly. Extremities: no clubbing, cyanosis, or edema Neurological: Alert and oriented to person place and time. Psychiatric: Normal mood and affect. Behavior is normal.  CBC    Component Value Date/Time   WBC 7.8 12/23/2013 1824   RBC 4.51 12/23/2013 1824   HGB 12.3 12/23/2013 1824   HCT 38.5 12/23/2013 1824   PLT 214 12/23/2013 1824   MCV 85.4 12/23/2013 1824   MCH 27.3 12/23/2013 1824   MCHC 31.9 12/23/2013 1824   RDW 15.0 12/23/2013 1824   LYMPHSABS 4.5* 12/23/2013 1824   MONOABS 0.4 12/23/2013 1824   EOSABS 0.2 12/23/2013 1824   BASOSABS 0.1 12/23/2013 1824    CMP     Component Value Date/Time   NA 141 12/23/2013 1824   K 4.1 12/23/2013 1824   CL 103 12/23/2013 1824   CO2 26 12/23/2013 1824   GLUCOSE 87 12/23/2013 1824   BUN 11 12/23/2013 1824   CREATININE 0.86 12/23/2013 1824   CALCIUM 9.2  12/23/2013 1824   PROT 7.3 12/23/2013 1824   ALBUMIN 3.7 12/23/2013 1824   AST 16 12/23/2013 1824   ALT 13 12/23/2013 1824   ALKPHOS 53 12/23/2013 1824   BILITOT <0.2* 12/23/2013 1824   GFRNONAA 82* 12/23/2013 1824   GFRAA >90 12/23/2013 1824   EGD/colon reviewed including pathology with the patient    Assessment & Plan:  43 yo female with PMH of H. pylori gastritis,  GERD, adenomatous colon polyps who is seen for follow-up.  1. H. pylori gastroduodenitis -- treated with Pylera. I recommended documentation of eradication with H. pylori stool antigen. This needs to be performed before restarting PPI. Will need retreatment if H. pylori still present  2. GERD -- continue heartburn almost days of the week without alarm symptoms. Resume pantoprazole 40 mg daily. GERD diet recommended  3. Irritable bowel -- her bowel habits are inconsistent and this is most consistent with irritable bowel. I have recommended Benefiber 1 tablespoon daily and the addition of Align as a probiotic. A more consistent diet will likely also help promote more regular bowel habits.  4. Adenomatous colon polyps -- repeat colonoscopy recommended in 5 years given her history of adenomatous colon polyps and her family history of colorectal cancer. Her son who is with her today is also advised to begin colorectal cancer screening early, my recommendation was age 53 for him.  8-12 week return visit for check up for #2 and 3

## 2014-04-04 NOTE — Patient Instructions (Signed)
Your physician has requested that you go to the basement for  lab work before leaving today. We have sent  medications to your pharmacy for you to pick up at your convenience. Resume pantoprazole after your stool test. Begin benefiber 1 tablespoon daily. Align 1 capsule daily.  These are over the counter medications. Follow up with Dr Hilarie Fredrickson in 2-3 months. CC:  Verdis Prime MD

## 2014-04-07 LAB — HELICOBACTER PYLORI  SPECIAL ANTIGEN: H. PYLORI Antigen: NEGATIVE

## 2014-09-13 IMAGING — CR DG CHEST 2V
2 series · 2 of 2 positions shown · non-contrast
Comparison: None.

CLINICAL DATA: Chest pain, headache

EXAM:
CHEST  2 VIEW

[w chest pa]
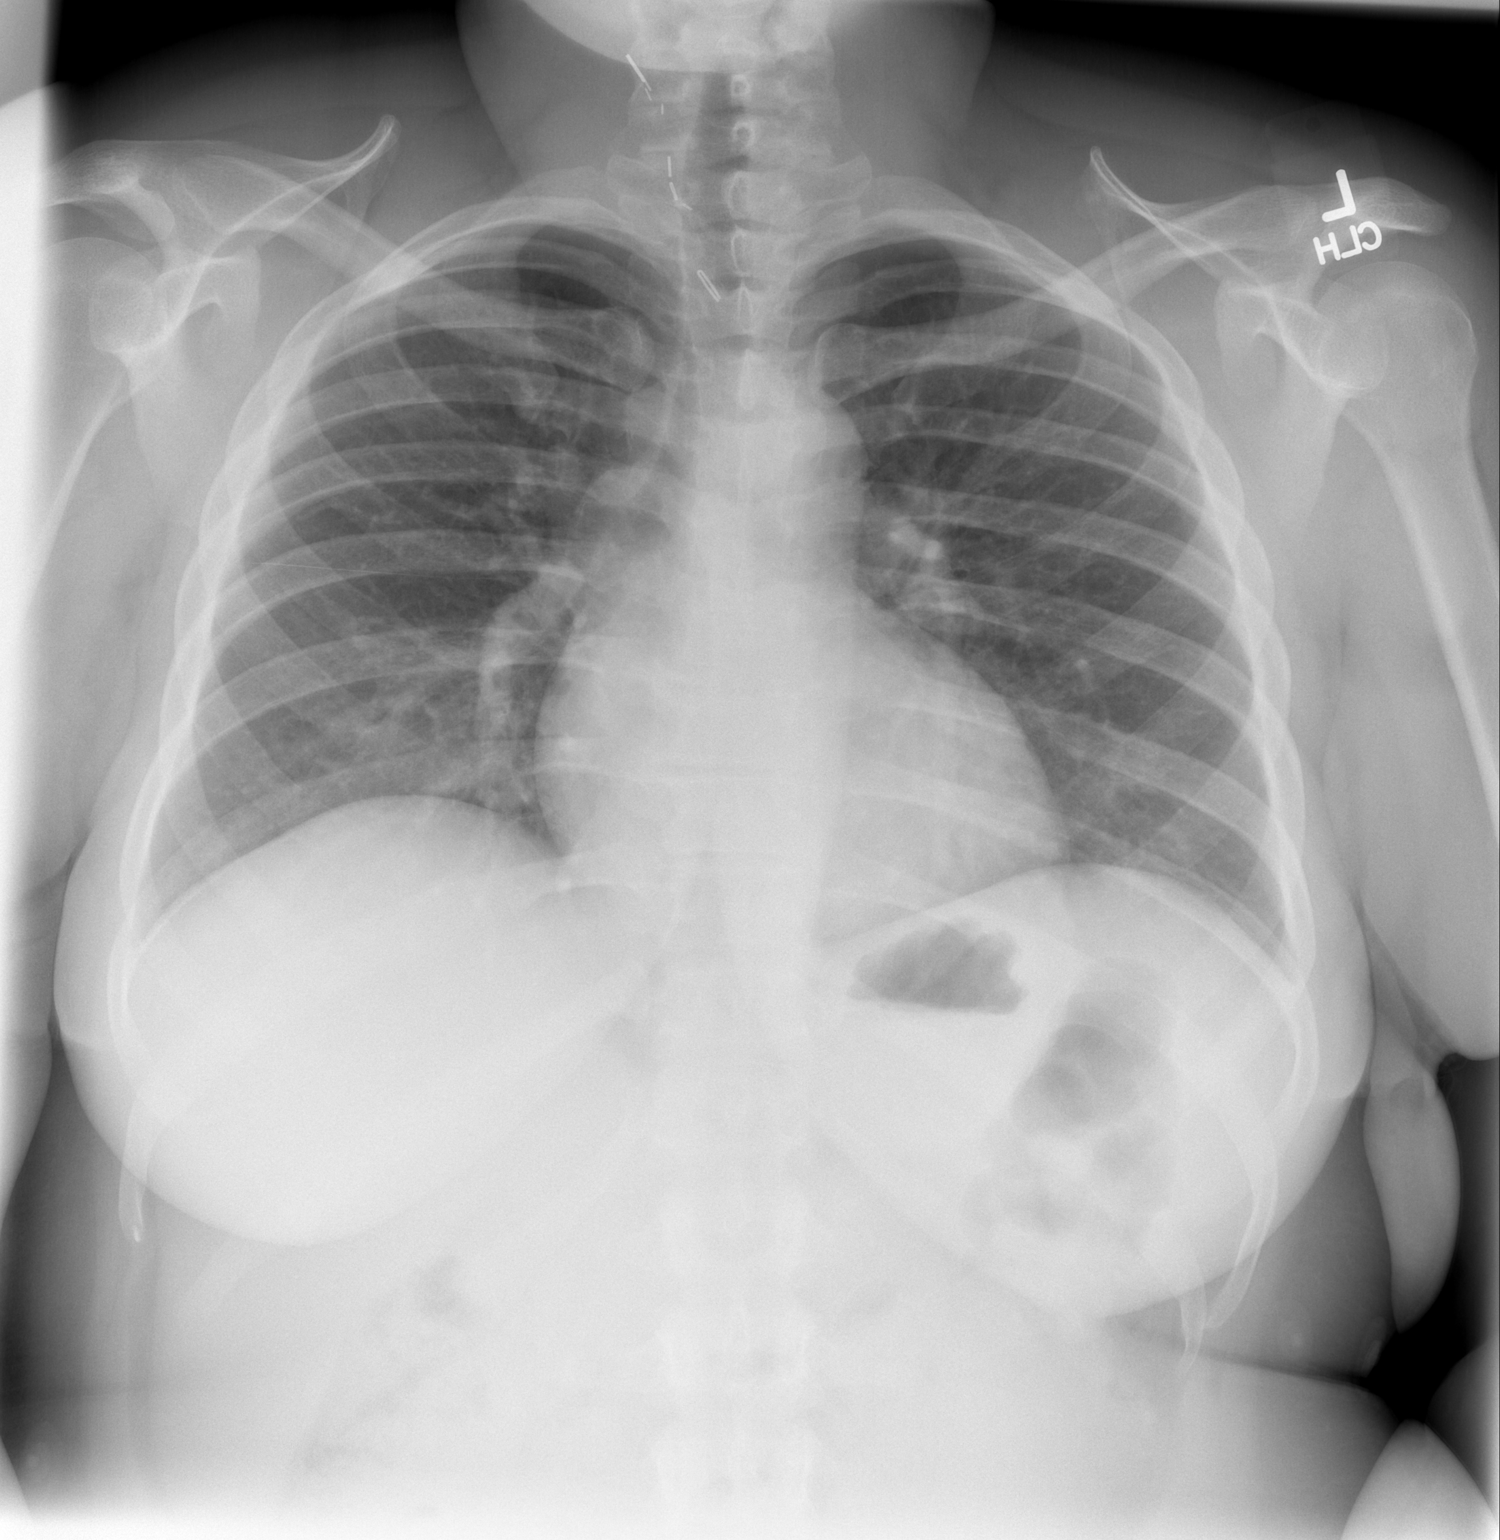

[w chest lat]
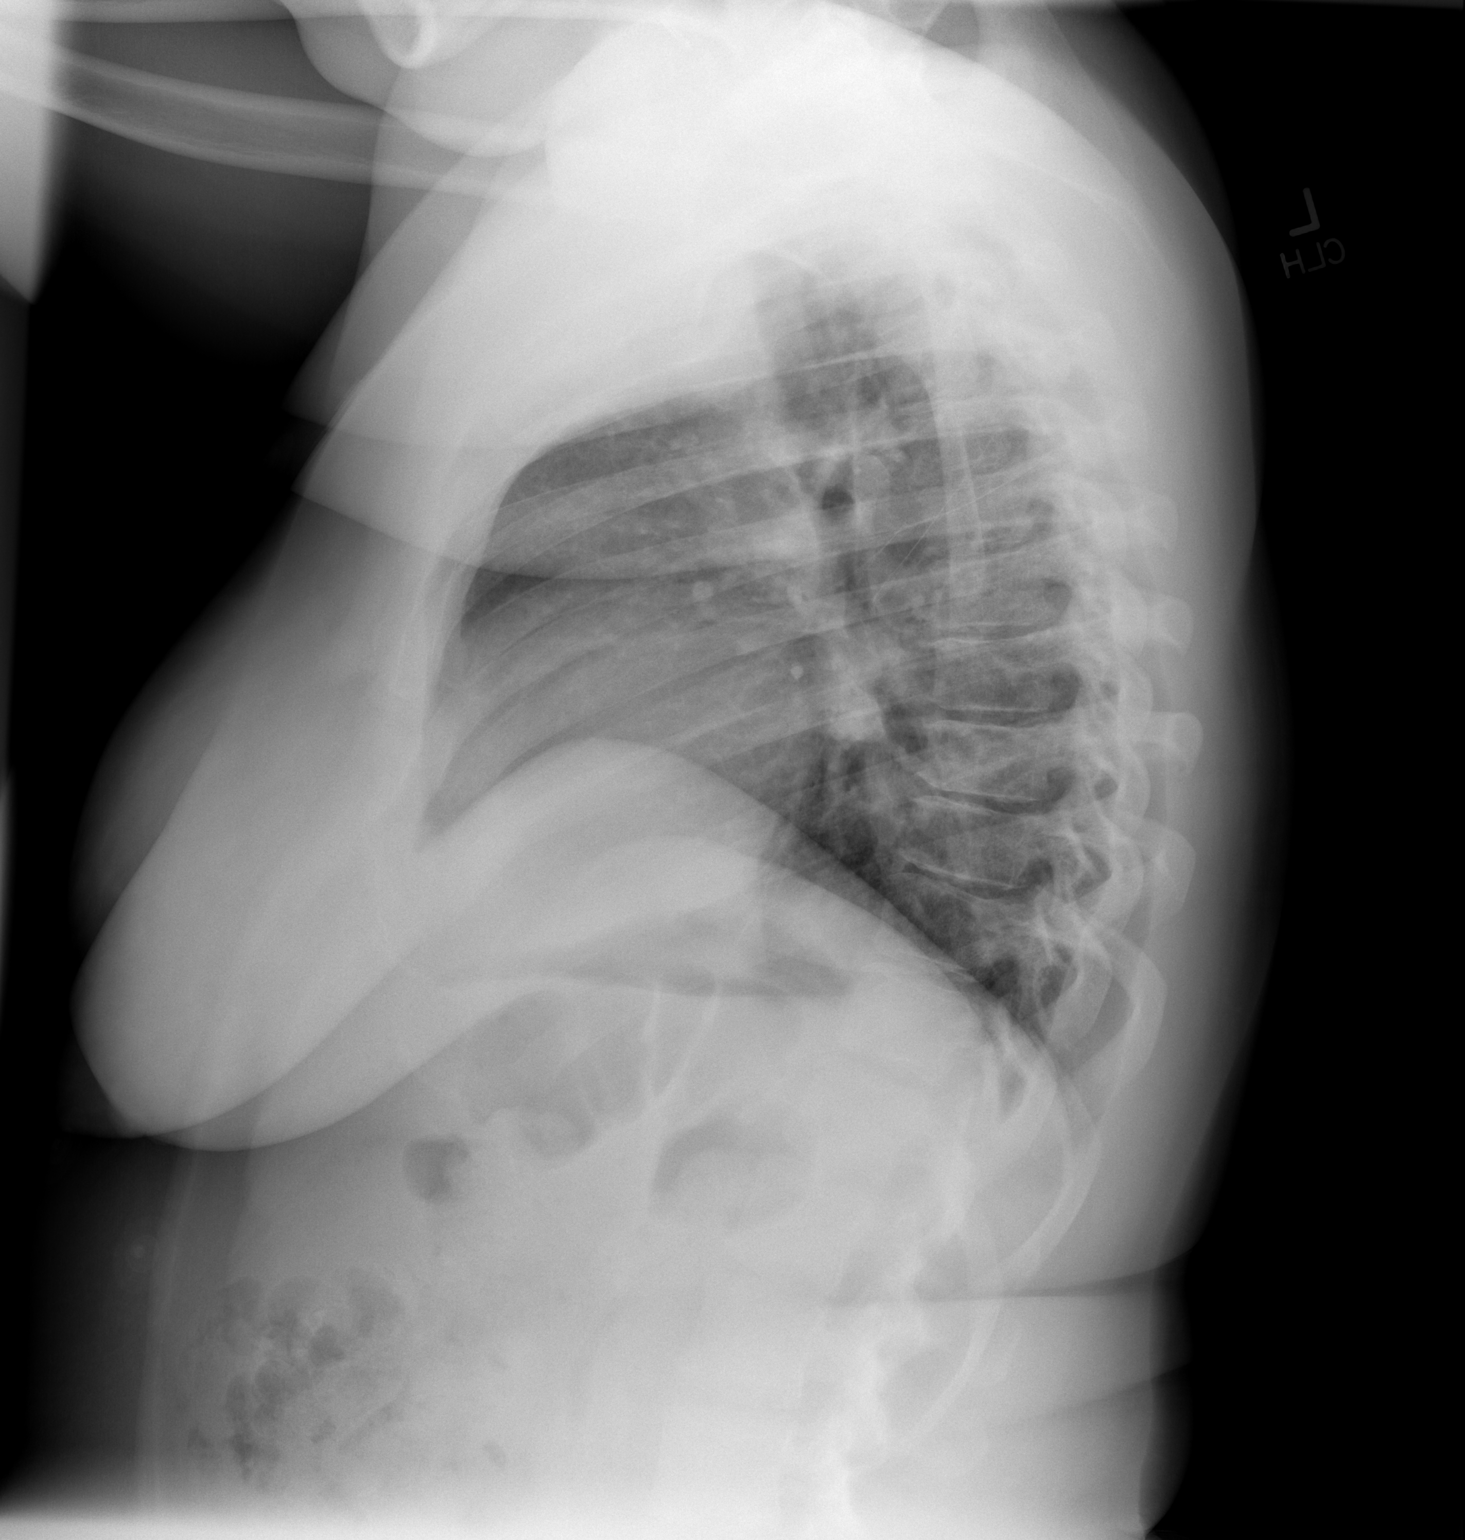

[2 of 2 positions shown; findings below may reference images not displayed]

FINDINGS: The heart size and mediastinal contours are within normal limits.
Both lungs are clear. The visualized skeletal structures are
unremarkable. Postop changes from thyroidectomy.
IMPRESSION: No active cardiopulmonary disease.

## 2015-05-30 IMAGING — CR DG FOOT COMPLETE 3+V*R*
3 series · 3 of 3 positions shown · non-contrast
Comparison: None.

CLINICAL DATA: Right foot swelling.

EXAM:
RIGHT FOOT COMPLETE - 3+ VIEW

[x foot ap right]
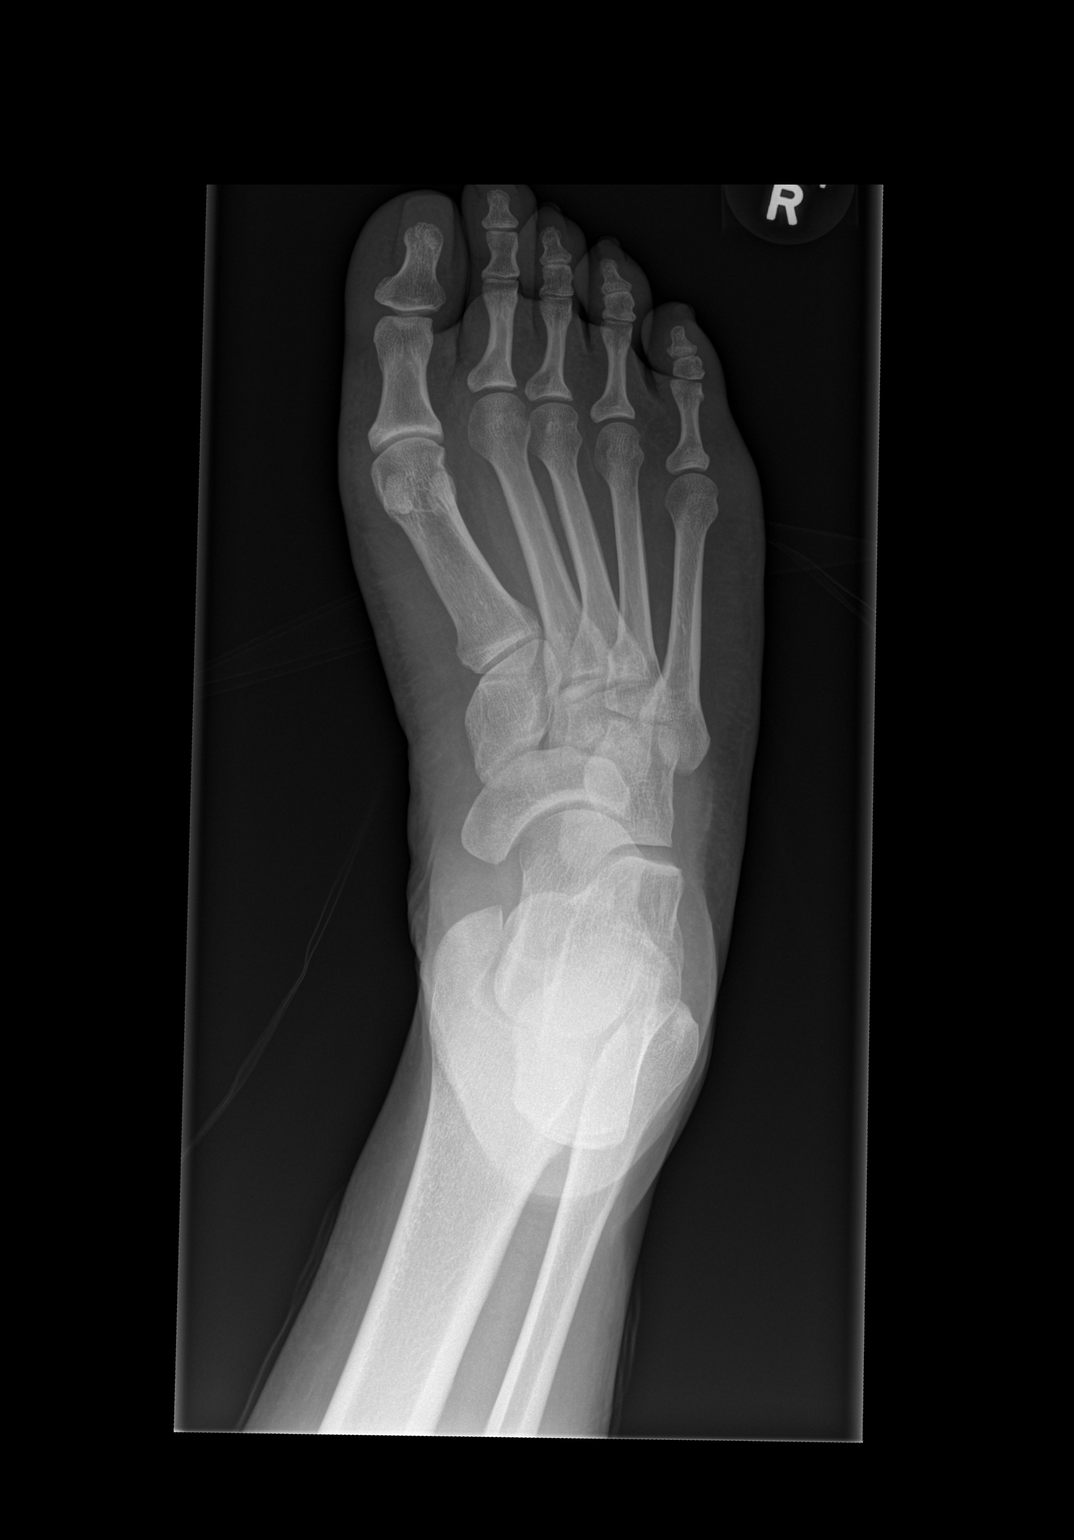

[x foot obl right]
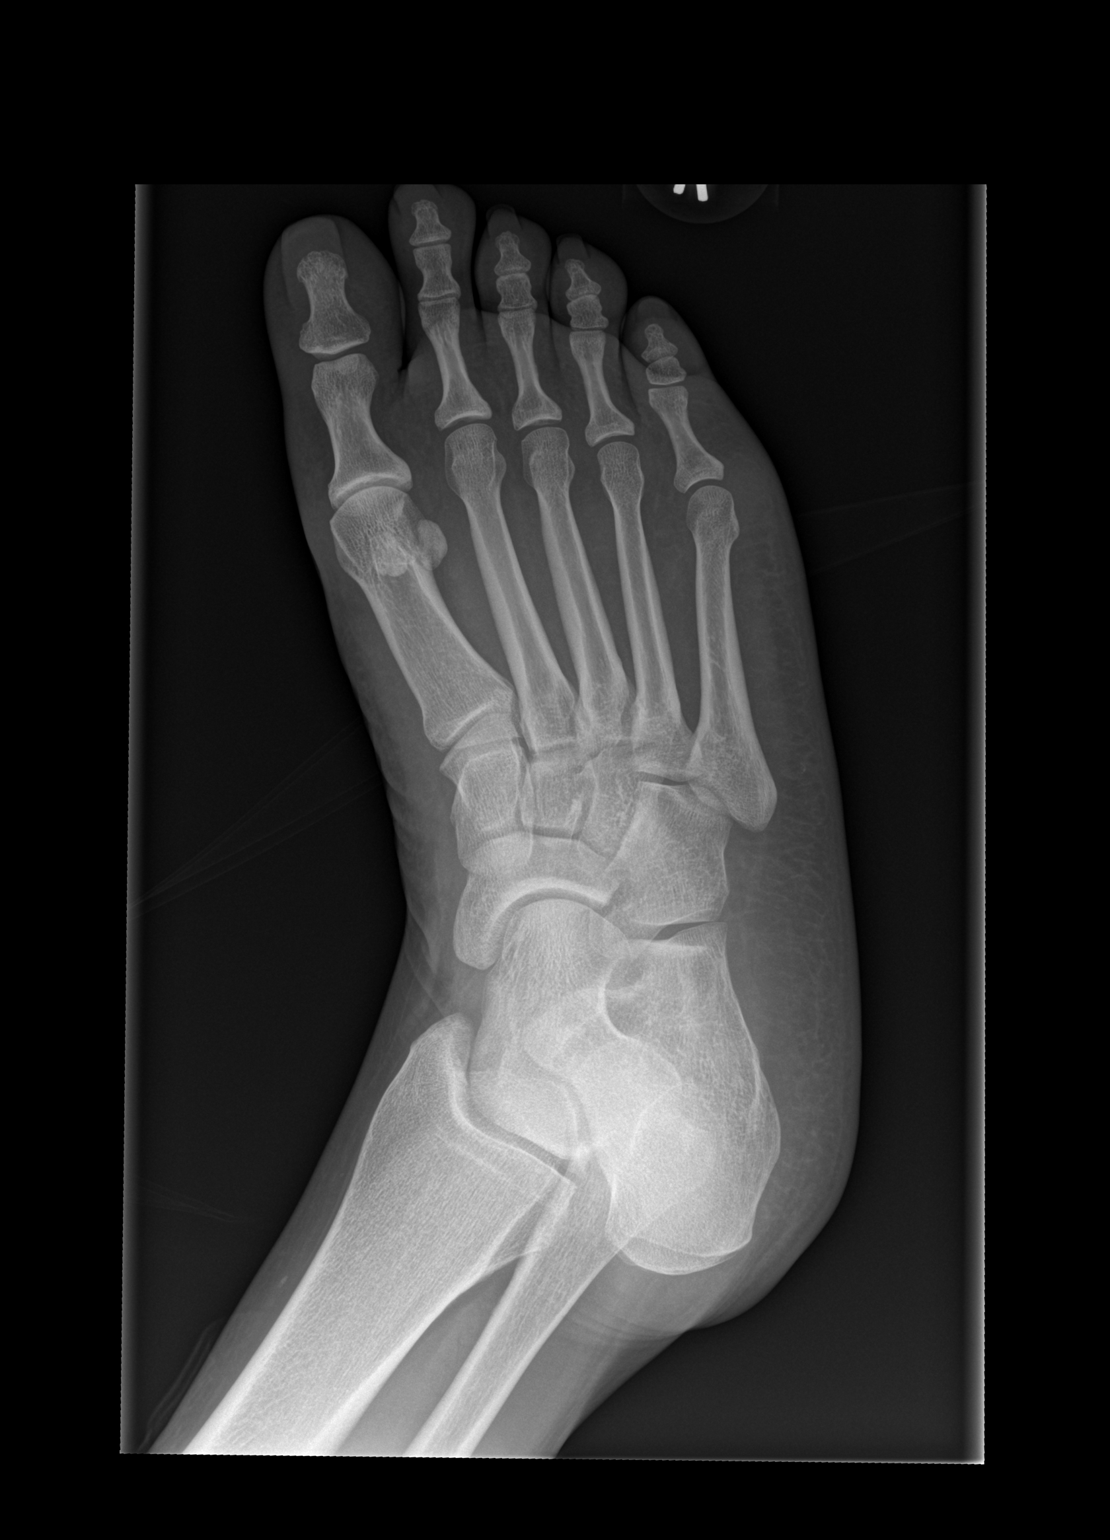

[x foot lat right]
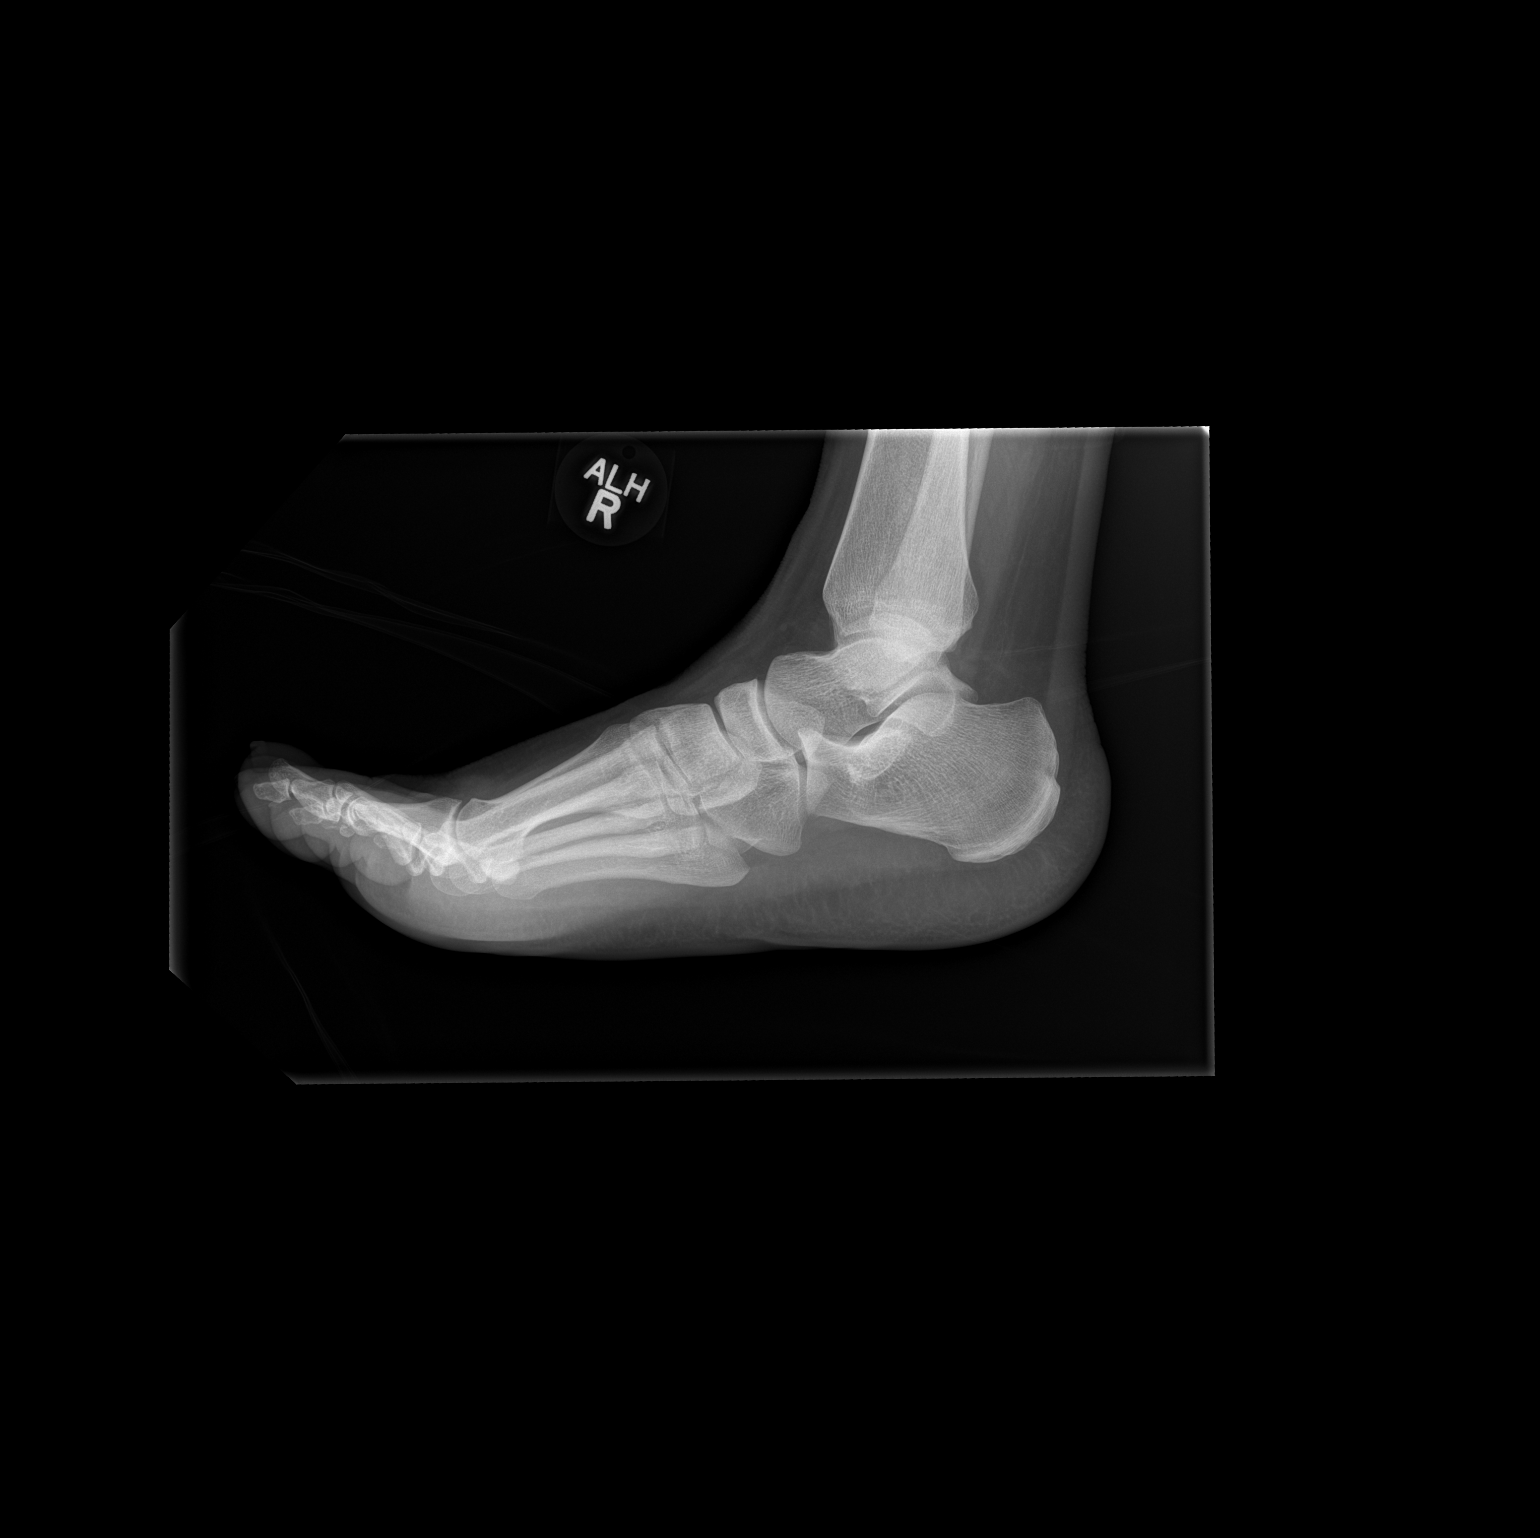

[3 of 3 positions shown; findings below may reference images not displayed]

FINDINGS: There is no evidence of fracture or dislocation. There is no
evidence of arthropathy or other focal bone abnormality. Mild
diffuse soft tissue swelling.
IMPRESSION: Negative.

## 2018-06-11 ENCOUNTER — Telehealth: Payer: Self-pay | Admitting: Internal Medicine

## 2018-06-11 ENCOUNTER — Encounter: Payer: Self-pay | Admitting: *Deleted

## 2018-06-11 NOTE — Telephone Encounter (Signed)
Transferred GI care to [facility].

## 2022-07-04 DIAGNOSIS — M545 Low back pain, unspecified: Secondary | ICD-10-CM | POA: Diagnosis not present

## 2022-07-04 DIAGNOSIS — R2242 Localized swelling, mass and lump, left lower limb: Secondary | ICD-10-CM | POA: Diagnosis not present

## 2022-07-04 DIAGNOSIS — M5431 Sciatica, right side: Secondary | ICD-10-CM | POA: Diagnosis not present

## 2022-07-16 DIAGNOSIS — Z981 Arthrodesis status: Secondary | ICD-10-CM | POA: Diagnosis not present

## 2022-07-16 DIAGNOSIS — M5416 Radiculopathy, lumbar region: Secondary | ICD-10-CM | POA: Diagnosis not present

## 2022-07-16 DIAGNOSIS — M4722 Other spondylosis with radiculopathy, cervical region: Secondary | ICD-10-CM | POA: Diagnosis not present

## 2022-07-16 DIAGNOSIS — M47816 Spondylosis without myelopathy or radiculopathy, lumbar region: Secondary | ICD-10-CM | POA: Diagnosis not present

## 2022-07-16 DIAGNOSIS — M5412 Radiculopathy, cervical region: Secondary | ICD-10-CM | POA: Diagnosis not present

## 2022-07-16 DIAGNOSIS — M47812 Spondylosis without myelopathy or radiculopathy, cervical region: Secondary | ICD-10-CM | POA: Diagnosis not present

## 2022-07-28 DIAGNOSIS — M5134 Other intervertebral disc degeneration, thoracic region: Secondary | ICD-10-CM | POA: Diagnosis not present

## 2022-07-28 DIAGNOSIS — M48061 Spinal stenosis, lumbar region without neurogenic claudication: Secondary | ICD-10-CM | POA: Diagnosis not present

## 2022-07-28 DIAGNOSIS — M5116 Intervertebral disc disorders with radiculopathy, lumbar region: Secondary | ICD-10-CM | POA: Diagnosis not present

## 2022-07-28 DIAGNOSIS — E7849 Other hyperlipidemia: Secondary | ICD-10-CM | POA: Diagnosis not present

## 2022-07-28 DIAGNOSIS — E890 Postprocedural hypothyroidism: Secondary | ICD-10-CM | POA: Diagnosis not present

## 2022-07-28 DIAGNOSIS — M4726 Other spondylosis with radiculopathy, lumbar region: Secondary | ICD-10-CM | POA: Diagnosis not present

## 2022-07-28 DIAGNOSIS — Z6838 Body mass index (BMI) 38.0-38.9, adult: Secondary | ICD-10-CM | POA: Diagnosis not present

## 2022-07-28 DIAGNOSIS — Z1231 Encounter for screening mammogram for malignant neoplasm of breast: Secondary | ICD-10-CM | POA: Diagnosis not present

## 2022-07-28 DIAGNOSIS — M5124 Other intervertebral disc displacement, thoracic region: Secondary | ICD-10-CM | POA: Diagnosis not present

## 2022-07-28 DIAGNOSIS — G5601 Carpal tunnel syndrome, right upper limb: Secondary | ICD-10-CM | POA: Diagnosis not present

## 2022-07-28 DIAGNOSIS — M5412 Radiculopathy, cervical region: Secondary | ICD-10-CM | POA: Diagnosis not present

## 2022-07-28 DIAGNOSIS — M4722 Other spondylosis with radiculopathy, cervical region: Secondary | ICD-10-CM | POA: Diagnosis not present

## 2022-07-28 DIAGNOSIS — E669 Obesity, unspecified: Secondary | ICD-10-CM | POA: Diagnosis not present

## 2022-07-28 DIAGNOSIS — M4727 Other spondylosis with radiculopathy, lumbosacral region: Secondary | ICD-10-CM | POA: Diagnosis not present

## 2022-07-28 DIAGNOSIS — M5117 Intervertebral disc disorders with radiculopathy, lumbosacral region: Secondary | ICD-10-CM | POA: Diagnosis not present

## 2022-09-04 DIAGNOSIS — G5601 Carpal tunnel syndrome, right upper limb: Secondary | ICD-10-CM | POA: Diagnosis not present

## 2022-09-04 DIAGNOSIS — M5416 Radiculopathy, lumbar region: Secondary | ICD-10-CM | POA: Diagnosis not present

## 2022-09-04 DIAGNOSIS — S161XXA Strain of muscle, fascia and tendon at neck level, initial encounter: Secondary | ICD-10-CM | POA: Diagnosis not present

## 2022-09-04 DIAGNOSIS — M5412 Radiculopathy, cervical region: Secondary | ICD-10-CM | POA: Diagnosis not present

## 2022-09-15 DIAGNOSIS — E890 Postprocedural hypothyroidism: Secondary | ICD-10-CM | POA: Diagnosis not present

## 2022-09-15 DIAGNOSIS — E041 Nontoxic single thyroid nodule: Secondary | ICD-10-CM | POA: Diagnosis not present

## 2022-09-25 DIAGNOSIS — M79601 Pain in right arm: Secondary | ICD-10-CM | POA: Diagnosis not present

## 2022-09-25 DIAGNOSIS — M5416 Radiculopathy, lumbar region: Secondary | ICD-10-CM | POA: Diagnosis not present

## 2022-09-25 DIAGNOSIS — M47816 Spondylosis without myelopathy or radiculopathy, lumbar region: Secondary | ICD-10-CM | POA: Diagnosis not present

## 2022-09-25 DIAGNOSIS — G8929 Other chronic pain: Secondary | ICD-10-CM | POA: Diagnosis not present

## 2022-11-24 DIAGNOSIS — R92323 Mammographic fibroglandular density, bilateral breasts: Secondary | ICD-10-CM | POA: Diagnosis not present

## 2022-11-24 DIAGNOSIS — Z1231 Encounter for screening mammogram for malignant neoplasm of breast: Secondary | ICD-10-CM | POA: Diagnosis not present

## 2022-11-25 DIAGNOSIS — G8929 Other chronic pain: Secondary | ICD-10-CM | POA: Diagnosis not present

## 2022-11-25 DIAGNOSIS — M47816 Spondylosis without myelopathy or radiculopathy, lumbar region: Secondary | ICD-10-CM | POA: Diagnosis not present

## 2022-11-25 DIAGNOSIS — M5416 Radiculopathy, lumbar region: Secondary | ICD-10-CM | POA: Diagnosis not present

## 2022-11-25 DIAGNOSIS — M79601 Pain in right arm: Secondary | ICD-10-CM | POA: Diagnosis not present

## 2022-11-27 DIAGNOSIS — Z0000 Encounter for general adult medical examination without abnormal findings: Secondary | ICD-10-CM | POA: Diagnosis not present

## 2022-11-27 DIAGNOSIS — Z5181 Encounter for therapeutic drug level monitoring: Secondary | ICD-10-CM | POA: Diagnosis not present

## 2022-11-27 DIAGNOSIS — Z720 Tobacco use: Secondary | ICD-10-CM | POA: Diagnosis not present

## 2022-11-27 DIAGNOSIS — F331 Major depressive disorder, recurrent, moderate: Secondary | ICD-10-CM | POA: Diagnosis not present

## 2022-11-27 DIAGNOSIS — Z91030 Bee allergy status: Secondary | ICD-10-CM | POA: Diagnosis not present

## 2022-11-27 DIAGNOSIS — N951 Menopausal and female climacteric states: Secondary | ICD-10-CM | POA: Diagnosis not present

## 2022-12-01 DIAGNOSIS — E041 Nontoxic single thyroid nodule: Secondary | ICD-10-CM | POA: Diagnosis not present

## 2022-12-01 DIAGNOSIS — R09A2 Foreign body sensation, throat: Secondary | ICD-10-CM | POA: Diagnosis not present

## 2023-01-12 DIAGNOSIS — E041 Nontoxic single thyroid nodule: Secondary | ICD-10-CM | POA: Diagnosis not present

## 2023-02-19 DIAGNOSIS — E669 Obesity, unspecified: Secondary | ICD-10-CM | POA: Diagnosis not present

## 2023-02-19 DIAGNOSIS — E042 Nontoxic multinodular goiter: Secondary | ICD-10-CM | POA: Diagnosis not present

## 2023-02-19 DIAGNOSIS — E041 Nontoxic single thyroid nodule: Secondary | ICD-10-CM | POA: Diagnosis not present

## 2023-02-19 DIAGNOSIS — R918 Other nonspecific abnormal finding of lung field: Secondary | ICD-10-CM | POA: Diagnosis not present

## 2023-02-19 DIAGNOSIS — E785 Hyperlipidemia, unspecified: Secondary | ICD-10-CM | POA: Diagnosis not present

## 2023-03-31 DIAGNOSIS — E890 Postprocedural hypothyroidism: Secondary | ICD-10-CM | POA: Diagnosis not present

## 2023-03-31 DIAGNOSIS — M62838 Other muscle spasm: Secondary | ICD-10-CM | POA: Diagnosis not present

## 2023-03-31 DIAGNOSIS — K219 Gastro-esophageal reflux disease without esophagitis: Secondary | ICD-10-CM | POA: Diagnosis not present

## 2023-03-31 DIAGNOSIS — E66812 Obesity, class 2: Secondary | ICD-10-CM | POA: Diagnosis not present

## 2023-03-31 DIAGNOSIS — Z6839 Body mass index (BMI) 39.0-39.9, adult: Secondary | ICD-10-CM | POA: Diagnosis not present

## 2023-03-31 DIAGNOSIS — F419 Anxiety disorder, unspecified: Secondary | ICD-10-CM | POA: Diagnosis not present

## 2023-04-01 DIAGNOSIS — E041 Nontoxic single thyroid nodule: Secondary | ICD-10-CM | POA: Diagnosis not present

## 2023-06-12 DIAGNOSIS — H5213 Myopia, bilateral: Secondary | ICD-10-CM | POA: Diagnosis not present

## 2023-07-31 DIAGNOSIS — H524 Presbyopia: Secondary | ICD-10-CM | POA: Diagnosis not present

## 2023-08-07 DIAGNOSIS — E890 Postprocedural hypothyroidism: Secondary | ICD-10-CM | POA: Diagnosis not present

## 2023-09-16 DIAGNOSIS — R519 Headache, unspecified: Secondary | ICD-10-CM | POA: Diagnosis not present

## 2023-09-29 DIAGNOSIS — F419 Anxiety disorder, unspecified: Secondary | ICD-10-CM | POA: Diagnosis not present

## 2023-09-29 DIAGNOSIS — Z760 Encounter for issue of repeat prescription: Secondary | ICD-10-CM | POA: Diagnosis not present

## 2023-09-29 DIAGNOSIS — M62838 Other muscle spasm: Secondary | ICD-10-CM | POA: Diagnosis not present

## 2023-09-29 DIAGNOSIS — Z13220 Encounter for screening for lipoid disorders: Secondary | ICD-10-CM | POA: Diagnosis not present

## 2023-09-29 DIAGNOSIS — M545 Low back pain, unspecified: Secondary | ICD-10-CM | POA: Diagnosis not present

## 2023-09-30 DIAGNOSIS — E890 Postprocedural hypothyroidism: Secondary | ICD-10-CM | POA: Diagnosis not present

## 2024-01-14 DIAGNOSIS — M791 Myalgia, unspecified site: Secondary | ICD-10-CM | POA: Diagnosis not present

## 2024-01-14 DIAGNOSIS — M5416 Radiculopathy, lumbar region: Secondary | ICD-10-CM | POA: Diagnosis not present

## 2024-01-14 DIAGNOSIS — M542 Cervicalgia: Secondary | ICD-10-CM | POA: Diagnosis not present

## 2024-01-14 DIAGNOSIS — Z6829 Body mass index (BMI) 29.0-29.9, adult: Secondary | ICD-10-CM | POA: Diagnosis not present

## 2024-02-08 DIAGNOSIS — E890 Postprocedural hypothyroidism: Secondary | ICD-10-CM | POA: Diagnosis not present

## 2024-02-12 DIAGNOSIS — Z6829 Body mass index (BMI) 29.0-29.9, adult: Secondary | ICD-10-CM | POA: Diagnosis not present

## 2024-02-12 DIAGNOSIS — M542 Cervicalgia: Secondary | ICD-10-CM | POA: Diagnosis not present

## 2024-02-12 DIAGNOSIS — M5416 Radiculopathy, lumbar region: Secondary | ICD-10-CM | POA: Diagnosis not present

## 2024-02-12 DIAGNOSIS — Z79899 Other long term (current) drug therapy: Secondary | ICD-10-CM | POA: Diagnosis not present

## 2024-03-14 DIAGNOSIS — M542 Cervicalgia: Secondary | ICD-10-CM | POA: Diagnosis not present

## 2024-03-14 DIAGNOSIS — Z79899 Other long term (current) drug therapy: Secondary | ICD-10-CM | POA: Diagnosis not present

## 2024-03-14 DIAGNOSIS — M5416 Radiculopathy, lumbar region: Secondary | ICD-10-CM | POA: Diagnosis not present

## 2024-03-14 DIAGNOSIS — Z6829 Body mass index (BMI) 29.0-29.9, adult: Secondary | ICD-10-CM | POA: Diagnosis not present
